# Patient Record
Sex: Female | Born: 1995 | Hispanic: No | Marital: Single | Smoking: Never smoker
Health system: Southern US, Community
[De-identification: ages and names within clinical notes are randomized; demographics above are authoritative.]

## PROBLEM LIST (undated history)

## (undated) DIAGNOSIS — E119 Type 2 diabetes mellitus without complications: Secondary | ICD-10-CM

---

## 2015-04-13 ENCOUNTER — Emergency Department (HOSPITAL_COMMUNITY): Payer: Self-pay

## 2015-04-13 ENCOUNTER — Encounter (HOSPITAL_COMMUNITY): Payer: Self-pay | Admitting: Emergency Medicine

## 2015-04-13 ENCOUNTER — Emergency Department (HOSPITAL_COMMUNITY)
Admission: EM | Admit: 2015-04-13 | Discharge: 2015-04-13 | Disposition: A | Discharge status: Home / Self Care | Payer: Self-pay | Attending: Emergency Medicine | Admitting: Emergency Medicine

## 2015-04-13 DIAGNOSIS — Z79899 Other long term (current) drug therapy: Secondary | ICD-10-CM | POA: Insufficient documentation

## 2015-04-13 DIAGNOSIS — R739 Hyperglycemia, unspecified: Secondary | ICD-10-CM

## 2015-04-13 DIAGNOSIS — E1165 Type 2 diabetes mellitus with hyperglycemia: Secondary | ICD-10-CM | POA: Insufficient documentation

## 2015-04-13 DIAGNOSIS — R112 Nausea with vomiting, unspecified: Secondary | ICD-10-CM | POA: Insufficient documentation

## 2015-04-13 DIAGNOSIS — R079 Chest pain, unspecified: Secondary | ICD-10-CM | POA: Insufficient documentation

## 2015-04-13 HISTORY — DX: Type 2 diabetes mellitus without complications: E11.9

## 2015-04-13 LAB — COMPREHENSIVE METABOLIC PANEL
ALK PHOS: 62 U/L (ref 38–126)
ALT: 38 U/L (ref 14–54)
AST: 23 U/L (ref 15–41)
Albumin: 4 g/dL (ref 3.5–5.0)
Anion gap: 14 (ref 5–15)
BUN: 9 mg/dL (ref 6–20)
CO2: 22 mmol/L (ref 22–32)
Calcium: 9.4 mg/dL (ref 8.9–10.3)
Chloride: 99 mmol/L — ABNORMAL LOW (ref 101–111)
Creatinine, Ser: 0.74 mg/dL (ref 0.44–1.00)
GFR calc non Af Amer: >60 mL/min (ref >60)
Glucose, Bld: 346 mg/dL — ABNORMAL HIGH (ref 65–99)
Potassium: 4.3 mmol/L (ref 3.5–5.1)
Sodium: 135 mmol/L (ref 135–145)
Total Bilirubin: 1.2 mg/dL (ref 0.3–1.2)
Total Protein: 8.3 g/dL — ABNORMAL HIGH (ref 6.5–8.1)

## 2015-04-13 LAB — CBC WITH DIFFERENTIAL/PLATELET
BASOS ABS: 0 10*3/uL (ref 0.0–0.1)
Basophils Relative: 0 % (ref 0–1)
Eosinophils Absolute: 0 10*3/uL (ref 0.0–0.7)
Eosinophils Relative: 0 % (ref 0–5)
HEMATOCRIT: 39.4 % (ref 36.0–46.0)
HEMOGLOBIN: 13.6 g/dL (ref 12.0–15.0)
Lymphocytes Relative: 18 % (ref 12–46)
Lymphs Abs: 2.5 10*3/uL (ref 0.7–4.0)
MCH: 29 pg (ref 26.0–34.0)
MCHC: 34.5 g/dL (ref 30.0–36.0)
MCV: 84 fL (ref 78.0–100.0)
MONO ABS: 0.5 10*3/uL (ref 0.1–1.0)
Monocytes Relative: 3 % (ref 3–12)
NEUTROS PCT: 79 % — AB (ref 43–77)
Neutro Abs: 11.1 10*3/uL — ABNORMAL HIGH (ref 1.7–7.7)
PLATELETS: 334 10*3/uL (ref 150–400)
RBC: 4.69 MIL/uL (ref 3.87–5.11)
RDW: 12.2 % (ref 11.5–15.5)
WBC: 14.1 10*3/uL — ABNORMAL HIGH (ref 4.0–10.5)

## 2015-04-13 LAB — URINALYSIS, ROUTINE W REFLEX MICROSCOPIC
Bilirubin Urine: NEGATIVE
Hgb urine dipstick: NEGATIVE
Ketones, ur: >80 mg/dL — AB
Leukocytes, UA: NEGATIVE
Nitrite: NEGATIVE
Protein, ur: 100 mg/dL — AB
Specific Gravity, Urine: 1.036 — ABNORMAL HIGH (ref 1.005–1.030)
UROBILINOGEN UA: 1 mg/dL (ref 0.0–1.0)
pH: 7 (ref 5.0–8.0)

## 2015-04-13 LAB — URINE MICROSCOPIC-ADD ON

## 2015-04-13 LAB — CBG MONITORING, ED
Glucose-Capillary: 313 mg/dL — ABNORMAL HIGH (ref 65–99)
Glucose-Capillary: 269 mg/dL — ABNORMAL HIGH (ref 65–99)

## 2015-04-13 LAB — I-STAT TROPONIN, ED: TROPONIN I, POC: 0.01 ng/mL (ref 0.00–0.08)

## 2015-04-13 LAB — POC URINE PREG, ED: Preg Test, Ur: NEGATIVE

## 2015-04-13 LAB — D-DIMER, QUANTITATIVE: D-Dimer, Quant: 0.64 ug/mL-FEU — ABNORMAL HIGH (ref 0.00–0.48)

## 2015-04-13 LAB — LIPASE, BLOOD: Lipase: 14 U/L — ABNORMAL LOW (ref 22–51)

## 2015-04-13 MED ORDER — METOCLOPRAMIDE HCL 10 MG PO TABS
10.0000 mg | ORAL_TABLET | Freq: Four times a day (QID) | ORAL | Status: AC
Start: 2015-04-13 — End: ?

## 2015-04-13 MED ORDER — SODIUM CHLORIDE 0.9 % IV BOLUS (SEPSIS)
1000.0000 mL | Freq: Once | INTRAVENOUS | Status: AC
  Administered 2015-04-13: 1000 mL via INTRAVENOUS

## 2015-04-13 MED ORDER — HYDROCODONE-ACETAMINOPHEN 5-325 MG PO TABS
1.0000 | ORAL_TABLET | Freq: Once | ORAL | Status: DC
  Filled 2015-04-13: qty 1

## 2015-04-13 MED ORDER — ONDANSETRON HCL 4 MG/2ML IJ SOLN
4.0000 mg | Freq: Once | INTRAMUSCULAR | Status: AC
  Administered 2015-04-13: 4 mg via INTRAVENOUS
  Filled 2015-04-13: qty 2

## 2015-04-13 MED ORDER — HYDROCODONE-ACETAMINOPHEN 5-325 MG PO TABS
1.0000 | ORAL_TABLET | Freq: Once | ORAL | Status: AC
Start: 2015-04-13 — End: 2015-04-13
  Administered 2015-04-13: 1 via ORAL
  Filled 2015-04-13 (×2): qty 1

## 2015-04-13 MED ORDER — HYDROCODONE-ACETAMINOPHEN 5-325 MG PO TABS: 1.0000 | ORAL_TABLET | Freq: Four times a day (QID) | ORAL | Status: DC | PRN

## 2015-04-13 MED ORDER — MORPHINE SULFATE 2 MG/ML IJ SOLN
2.0000 mg | Freq: Once | INTRAMUSCULAR | Status: AC
  Administered 2015-04-13: 2 mg via INTRAVENOUS
  Filled 2015-04-13: qty 1

## 2015-04-13 MED ORDER — METOCLOPRAMIDE HCL 5 MG/ML IJ SOLN
10.0000 mg | Freq: Once | INTRAMUSCULAR | Status: AC
  Administered 2015-04-13: 10 mg via INTRAVENOUS
  Filled 2015-04-13: qty 2

## 2015-04-13 MED ORDER — GI COCKTAIL ~~LOC~~
30.0000 mL | Freq: Once | ORAL | Status: AC
  Administered 2015-04-13: 30 mL via ORAL
  Filled 2015-04-13: qty 30

## 2015-04-13 MED ORDER — IOHEXOL 350 MG/ML SOLN
80.0000 mL | Freq: Once | INTRAVENOUS | Status: AC | PRN
  Administered 2015-04-13: 16:00:00 via INTRAVENOUS

## 2015-04-13 NOTE — ED Provider Notes (Signed)
Patient CTA negative for pulmonary embolism or other acute finding. Patient tolerating fluids in the ED. Patient well appearing nontoxic and stable for discharge. Discussed ibuprofen patient given prescription for Norco. Driving and sedation precautions provided.  Discussed return precautions with patient. Discussed all results and patient verbalizes understanding and agrees with plan.  Meds given in ED:  Medications  HYDROcodone-acetaminophen (NORCO/VICODIN) 5-325 MG per tablet 1 tablet (1 tablet Oral Not Given 04/13/15 1611)  sodium chloride 0.9 % bolus 1,000 mL (0 mLs Intravenous Stopped 04/13/15 1110)  metoCLOPramide (REGLAN) injection 10 mg (10 mg Intravenous Given 04/13/15 0950)  morphine 2 MG/ML injection 2 mg (2 mg Intravenous Given 04/13/15 0952)  sodium chloride 0.9 % bolus 1,000 mL (0 mLs Intravenous Stopped 04/13/15 1330)  ondansetron (ZOFRAN) injection 4 mg (4 mg Intravenous Given 04/13/15 1132)  HYDROcodone-acetaminophen (NORCO/VICODIN) 5-325 MG per tablet 1 tablet (1 tablet Oral Given 04/13/15 1231)  gi cocktail (Maalox,Lidocaine,Donnatal) (30 mLs Oral Given 04/13/15 1357)  sodium chloride 0.9 % bolus 1,000 mL (1,000 mLs Intravenous New Bag/Given 04/13/15 1611)  iohexol (OMNIPAQUE) 350 MG/ML injection 80 mL ( Intravenous Contrast Given 04/13/15 1559)  metoCLOPramide (REGLAN) injection 10 mg (10 mg Intravenous Given 04/13/15 1616)    New Prescriptions   HYDROCODONE-ACETAMINOPHEN (NORCO/VICODIN) 5-325 MG PER TABLET    Take 1 tablet by mouth every 6 (six) hours as needed.   METOCLOPRAMIDE (REGLAN) 10 MG TABLET    Take 1 tablet (10 mg total) by mouth every 6 (six) hours.    Results for orders placed or performed during the hospital encounter of 04/13/15  CBC with Differential  Result Value Ref Range   WBC 14.1 (H) 4.0 - 10.5 K/uL   RBC 4.69 3.87 - 5.11 MIL/uL   Hemoglobin 13.6 12.0 - 15.0 g/dL   HCT 38.1 01.7 - 51.0 %   MCV 84.0 78.0 - 100.0 fL   MCH 29.0 26.0 - 34.0 pg   MCHC 34.5  30.0 - 36.0 g/dL   RDW 25.8 52.7 - 78.2 %   Platelets 334 150 - 400 K/uL   Neutrophils Relative % 79 (H) 43 - 77 %   Neutro Abs 11.1 (H) 1.7 - 7.7 K/uL   Lymphocytes Relative 18 12 - 46 %   Lymphs Abs 2.5 0.7 - 4.0 K/uL   Monocytes Relative 3 3 - 12 %   Monocytes Absolute 0.5 0.1 - 1.0 K/uL   Eosinophils Relative 0 0 - 5 %   Eosinophils Absolute 0.0 0.0 - 0.7 K/uL   Basophils Relative 0 0 - 1 %   Basophils Absolute 0.0 0.0 - 0.1 K/uL  Comprehensive metabolic panel  Result Value Ref Range   Sodium 135 135 - 145 mmol/L   Potassium 4.3 3.5 - 5.1 mmol/L   Chloride 99 (L) 101 - 111 mmol/L   CO2 22 22 - 32 mmol/L   Glucose, Bld 346 (H) 65 - 99 mg/dL   BUN 9 6 - 20 mg/dL   Creatinine, Ser 4.23 0.44 - 1.00 mg/dL   Calcium 9.4 8.9 - 53.6 mg/dL   Total Protein 8.3 (H) 6.5 - 8.1 g/dL   Albumin 4.0 3.5 - 5.0 g/dL   AST 23 15 - 41 U/L   ALT 38 14 - 54 U/L   Alkaline Phosphatase 62 38 - 126 U/L   Total Bilirubin 1.2 0.3 - 1.2 mg/dL   GFR calc non Af Amer >60 >60 mL/min   GFR calc Af Amer >60 >60 mL/min   Anion gap 14  5 - 15  Urinalysis, Routine w reflex microscopic (not at [facility])  Result Value Ref Range   Color, Urine YELLOW YELLOW   APPearance CLEAR CLEAR   Specific Gravity, Urine 1.036 (H) 1.005 - 1.030   pH 7.0 5.0 - 8.0   Glucose, UA >1000 (A) NEGATIVE mg/dL   Hgb urine dipstick NEGATIVE NEGATIVE   Bilirubin Urine NEGATIVE NEGATIVE   Ketones, ur >80 (A) NEGATIVE mg/dL   Protein, ur 161 (A) NEGATIVE mg/dL   Urobilinogen, UA 1.0 0.0 - 1.0 mg/dL   Nitrite NEGATIVE NEGATIVE   Leukocytes, UA NEGATIVE NEGATIVE  Lipase, blood  Result Value Ref Range   Lipase 14 (L) 22 - 51 U/L  Urine microscopic-add on  Result Value Ref Range   Squamous Epithelial / LPF RARE RARE   Bacteria, UA RARE RARE  D-dimer, quantitative (not at [facility])  Result Value Ref Range   D-Dimer, Quant 0.64 (H) 0.00 - 0.48 ug/mL-FEU  POC Urine Pregnancy, ED (do NOT order at [facility])  Result Value Ref Range   Preg Test,  Ur NEGATIVE NEGATIVE  CBG monitoring, ED  Result Value Ref Range   Glucose-Capillary 313 (H) 65 - 99 mg/dL  CBG monitoring, ED  Result Value Ref Range   Glucose-Capillary 269 (H) 65 - 99 mg/dL  I-Stat Troponin, ED (not at [facility])  Result Value Ref Range   Troponin i, poc 0.01 0.00 - 0.08 ng/mL   Comment 3           Ct Angio Chest Pe W/cm &/or Wo Cm  04/13/2015   CLINICAL DATA:  Chest pain for 2 days.  Recent plane travel  EXAM: CT ANGIOGRAPHY CHEST WITH CONTRAST  TECHNIQUE: Multidetector CT imaging of the chest was performed using the standard protocol during bolus administration of intravenous contrast. Multiplanar CT image reconstructions and MIPs were obtained to evaluate the vascular anatomy.  CONTRAST:  1 OMNIPAQUE IOHEXOL 350 MG/ML SOLN  COMPARISON:  None.  FINDINGS: THORACIC INLET/BODY WALL:  No acute abnormality.  MEDIASTINUM:  Normal heart size. No pericardial effusion. Prominent thymus, but within normal limits for age. No acute vascular abnormality, including pulmonary embolism. No adenopathy.  LUNG WINDOWS:  No consolidation.  No effusion.  UPPER ABDOMEN:  Negative.  OSSEOUS:  Negative.  Review of the MIP images confirms the above findings.  IMPRESSION: Negative for pulmonary embolism or other acute finding.   Electronically Signed   By: Marnee Spring M.D.   On: 04/13/2015 16:26   Dg Abd Acute W/chest  04/13/2015   CLINICAL DATA:  Vomiting since yesterday morning, chest pain beginning yesterday later in day, history diabetes  EXAM: DG ABDOMEN ACUTE W/ 1V CHEST  COMPARISON:  None  FINDINGS: Upper normal heart size. None Mediastinal contours and pulmonary vascularity normal.  Lungs clear.  No pleural effusion or pneumothorax.  Bowel gas pattern normal.  No bowel dilatation or bowel wall thickening or free intraperitoneal air.  Tiny rounded calcification in RIGHT pelvis likely represents a phlebolith, see below.  No definite urinary tract calcification.  Osseous structures normal.   IMPRESSION: No acute abnormalities.  Probable phlebolith in RIGHT pelvis though if patient says symptoms which could be related to RIGHT ureterolithiasis, recommend correlation with urinalysis.   Electronically Signed   By: Ulyses Southward M.D.   On: 04/13/2015 10:42      Oswaldo Conroy, PA-C 04/13/15 1703  Doug Sou, MD 04/13/15 [phone]

## 2015-04-13 NOTE — Discharge Instructions (Signed)
Return to the emergency room with worsening of symptoms, new symptoms or with symptoms that are concerning , especially chest pain that feels like a pressure, spreads to left arm or jaw, worse with exertion, associated with nausea, vomiting, shortness of breath and/or sweating.  Please call your doctor for a followup appointment within 24-48 hours. When you talk to your doctor please let them know that you were seen in the emergency department and have them acquire all of your records so that they can discuss the findings with you and formulate a treatment plan to fully care for your new and ongoing problems. RICE: Rest, Ice (three cycles of 20 mins on, off at least twice a day), compression/brace, elevation. Heating pad works well for back pain. Ibuprofen  (2 tablets ) every 5-6 hours for 3-5 days. Norco for severe pain. Do not operate machinery, drive or drink alcohol while taking narcotics or muscle relaxers. Read below information and follow recommendations.   Chest Pain (Nonspecific) It is often hard to give a specific diagnosis for the cause of chest pain. There is always a chance that your pain could be related to something serious, such as a heart attack or a blood clot in the lungs. You need to follow up with your health care provider for further evaluation. CAUSES   Heartburn.  Pneumonia or bronchitis.  Anxiety or stress.  Inflammation around your heart (pericarditis) or lung (pleuritis or pleurisy).  A blood clot in the lung.  A collapsed lung (pneumothorax). It can develop suddenly on its own (spontaneous pneumothorax) or from trauma to the chest.  Shingles infection (herpes zoster virus). The chest wall is composed of bones, muscles, and cartilage. Any of these can be the source of the pain.  The bones can be bruised by injury.  The muscles or cartilage can be strained by coughing or overwork.  The cartilage can be affected by inflammation and become sore  (costochondritis). DIAGNOSIS  Lab tests or other studies may be needed to find the cause of your pain. Your health care provider may have you take a test called an ambulatory electrocardiogram (ECG). An ECG records your heartbeat patterns over a 24-hour period. You may also have other tests, such as:  Transthoracic echocardiogram (TTE). During echocardiography, sound waves are used to evaluate how blood flows through your heart.  Transesophageal echocardiogram (TEE).  Cardiac monitoring. This allows your health care provider to monitor your heart rate and rhythm in real time.  Holter monitor. This is a portable device that records your heartbeat and can help diagnose heart arrhythmias. It allows your health care provider to track your heart activity for several days, if needed.  Stress tests by exercise or by giving medicine that makes the heart beat faster. TREATMENT   Treatment depends on what may be causing your chest pain. Treatment may include:  Acid blockers for heartburn.  Anti-inflammatory medicine.  Pain medicine for inflammatory conditions.  Antibiotics if an infection is present.  You may be advised to change lifestyle habits. This includes stopping smoking and avoiding alcohol, caffeine, and chocolate.  You may be advised to keep your head raised (elevated) when sleeping. This reduces the chance of acid going backward from your stomach into your esophagus. Most of the time, nonspecific chest pain will improve within 2-3 days with rest and mild pain medicine.  HOME CARE INSTRUCTIONS   If antibiotics were prescribed, take them as directed. Finish them even if you start to feel better.  For the  next few days, avoid physical activities that bring on chest pain. Continue physical activities as directed.  Do not use any tobacco products, including cigarettes, chewing tobacco, or electronic cigarettes.  Avoid drinking alcohol.  Only take medicine as directed by your health  care provider.  Follow your health care provider's suggestions for further testing if your chest pain does not go away.  Keep any follow-up appointments you made. If you do not go to an appointment, you could develop lasting (chronic) problems with pain. If there is any problem keeping an appointment, call to reschedule. SEEK MEDICAL CARE IF:   Your chest pain does not go away, even after treatment.  You have a rash with blisters on your chest.  You have a fever. SEEK IMMEDIATE MEDICAL CARE IF:   You have increased chest pain or pain that spreads to your arm, neck, jaw, back, or abdomen.  You have shortness of breath.  You have an increasing cough, or you cough up blood.  You have severe back or abdominal pain.  You feel nauseous or vomit.  You have severe weakness.  You faint.  You have chills. This is an emergency. Do not wait to see if the pain will go away. Get medical help at once. Call your local emergency services (911 in [place] Do not drive yourself to the hospital. MAKE SURE YOU:   Understand these instructions.  Will watch your condition.  Will get help right away if you are not doing well or get worse. Document Released: 07/30/2005 Document Revised: 10/25/2013 Document Reviewed: 05/25/2008 [facility] Patient Information 2015 [place], [place]. This information is not intended to replace advice given to you by your health care provider. Make sure you discuss any questions you have with your health care provider.  Hyperglycemia Hyperglycemia occurs when the glucose (sugar) in your blood is too high. Hyperglycemia can happen for many reasons, but it most often happens to people who do not know they have diabetes or are not managing their diabetes properly.  CAUSES  Whether you have diabetes or not, there are other causes of hyperglycemia. Hyperglycemia can occur when you have diabetes, but it can also occur in other situations that you might not be as aware of, such  as: Diabetes  If you have diabetes and are having problems controlling your blood glucose, hyperglycemia could occur because of some of the following reasons:  Not following your meal plan.  Not taking your diabetes medications or not taking it properly.  Exercising less or doing less activity than you normally do.  Being sick. Pre-diabetes  This cannot be ignored. Before people develop Type 2 diabetes, they almost always have "pre-diabetes." This is when your blood glucose levels are higher than normal, but not yet high enough to be diagnosed as diabetes. Research has shown that some long-term damage to the body, especially the heart and circulatory system, may already be occurring during pre-diabetes. If you take action to manage your blood glucose when you have pre-diabetes, you may delay or prevent Type 2 diabetes from developing. Stress  If you have diabetes, you may be "diet" controlled or on oral medications or insulin to control your diabetes. However, you may find that your blood glucose is higher than usual in the hospital whether you have diabetes or not. This is often referred to as "stress hyperglycemia." Stress can elevate your blood glucose. This happens because of hormones put out by the body during times of stress. If stress has been the cause of  your high blood glucose, it can be followed regularly by your caregiver. That way he/she can make sure your hyperglycemia does not continue to get worse or progress to diabetes. Steroids  Steroids are medications that act on the infection fighting system (immune system) to block inflammation or infection. One side effect can be a rise in blood glucose. Most people can produce enough extra insulin to allow for this rise, but for those who cannot, steroids make blood glucose levels go even higher. It is not unusual for steroid treatments to "uncover" diabetes that is developing. It is not always possible to determine if the hyperglycemia  will go away after the steroids are stopped. A special blood test called an A1c is sometimes done to determine if your blood glucose was elevated before the steroids were started. SYMPTOMS  Thirsty.  Frequent urination.  Dry mouth.  Blurred vision.  Tired or fatigue.  Weakness.  Sleepy.  Tingling in feet or leg. DIAGNOSIS  Diagnosis is made by monitoring blood glucose in one or all of the following ways:  A1c test. This is a chemical found in your blood.  Fingerstick blood glucose monitoring.  Laboratory results. TREATMENT  First, knowing the cause of the hyperglycemia is important before the hyperglycemia can be treated. Treatment may include, but is not be limited to:  Education.  Change or adjustment in medications.  Change or adjustment in meal plan.  Treatment for an illness, infection, etc.  More frequent blood glucose monitoring.  Change in exercise plan.  Decreasing or stopping steroids.  Lifestyle changes. HOME CARE INSTRUCTIONS   Test your blood glucose as directed.  Exercise regularly. Your caregiver will give you instructions about exercise. Pre-diabetes or diabetes which comes on with stress is helped by exercising.  Eat wholesome, balanced meals. Eat often and at regular, fixed times. Your caregiver or nutritionist will give you a meal plan to guide your sugar intake.  Being at an ideal weight is important. If needed, losing as little as 10 to 15 pounds may help improve blood glucose levels. SEEK MEDICAL CARE IF:   You have questions about medicine, activity, or diet.  You continue to have symptoms (problems such as increased thirst, urination, or weight gain). SEEK IMMEDIATE MEDICAL CARE IF:   You are vomiting or have diarrhea.  Your breath smells fruity.  You are breathing faster or slower.  You are very sleepy or incoherent.  You have numbness, tingling, or pain in your feet or hands.  You have chest pain.  Your symptoms get  worse even though you have been following your caregiver's orders.  If you have any other questions or concerns. Document Released: 04/15/2001 Document Revised: 01/12/2012 Document Reviewed: 02/16/2012 [facility] Patient Information 2015 [place], [place]. This information is not intended to replace advice given to you by your health care provider. Make sure you discuss any questions you have with your health care provider.

## 2015-04-13 NOTE — ED Provider Notes (Signed)
Complains of vomiting multiple times since 8 AM yesterday accompanied by anterior chest pain which is constant since 8 AM yesterday no abdominal pain last bowel movement 12 PM yesterday. No fever no other associated symptoms she is asymptomatic since treatment here except "I feel tired" on exam no distress lungs clear auscultation heart regular rate and rhythm no murmurs or rubs abdomen obese, nontender. Extremities without edema X-rays viewed by me. No signs of acute appendicitis. Strongly doubt cardiac etiology in this young female with atypical symptoms only risk factor being diabetes  Doug Sou, MD 04/13/15 [phone]

## 2015-04-13 NOTE — ED Notes (Signed)
Pt reports vomiting since yesterday morning. Pt also report CP associated with the vomiting. Pt is diabetic. NAD at this time.

## 2015-04-13 NOTE — ED Notes (Signed)
Pts SpO2 went to 70% while sleeping when pt awoke O2 improved to 95%, pt placed on [place] 2L and O2 improved to 100%

## 2015-04-13 NOTE — ED Provider Notes (Signed)
CSN: [id number]     Arrival date & time 04/13/15  7289 History   First MD Initiated Contact with Patient 04/13/15 0915     Chief Complaint  Patient presents with  . Emesis     (Consider location/radiation/quality/duration/timing/severity/associated sxs/prior Treatment) HPI  Haley Cannon is a 19 y.o. female with PMH of DM on metformin presenting with nausea and vomiting since yesterday morning. Patient unclear how many times. Patient states he has associated chest tightness when she vomits results after emesis. Patient denies any hematemesis or blood in emesis. Patient denies abdominal pain. No fevers or chills. No vaginal symptoms. No urinary symptoms. Patient denies diarrhea. Last BM yesterday and normal without blood. Patient denies history of abdominal surgeries. Patient denies history of DKA. No history of cardiac disease. Pt denies history of DVT, PE, recent surgery or trauma, malignancy, hemoptysis, exogenous estrogen use, unilateral leg swelling or tenderness. Pt with recent flight from [place] which was 5-6 hours. Pt endorses ambulating once during trip.    Past Medical History  Diagnosis Date  . Diabetes mellitus without complication    History reviewed. No pertinent past surgical history. No family history on file. History  Substance Use Topics  . Smoking status: Never Smoker   . Smokeless tobacco: Not on file  . Alcohol Use: No   OB History    No data available     Review of Systems 10 Systems reviewed and are negative for acute change except as noted in the HPI.    Allergies  Review of patient's allergies indicates no known allergies.  Home Medications   Prior to Admission medications   Medication Sig Start Date End Date Taking? Authorizing Provider  metFORMIN (GLUCOPHAGE) 500 MG tablet Take 500 mg by mouth 2 (two) times daily. 02/26/15  Yes Historical Provider, MD   BP 105/64 mmHg  Pulse 87  Temp(Src) 98 F (36.7 C) (Oral)  Resp 19  SpO2 99%  LMP  03/13/2015 Physical Exam  Constitutional: She appears well-developed and well-nourished. No distress.  HENT:  Head: Normocephalic and atraumatic.  Dry mucous membranes  Eyes: Conjunctivae and EOM are normal. Right eye exhibits no discharge. Left eye exhibits no discharge.  Cardiovascular: Normal rate and regular rhythm.   Pulmonary/Chest: Effort normal and breath sounds normal. No respiratory distress. She has no wheezes.  Abdominal: Soft. Bowel sounds are normal. She exhibits no distension. There is no tenderness.  Neurological: She is alert. She exhibits normal muscle tone. Coordination normal.  Skin: Skin is warm and dry. She is not diaphoretic.  Nursing note and vitals reviewed.   ED Course  Procedures (including critical care time) Labs Review Labs Reviewed  CBC WITH DIFFERENTIAL/PLATELET - Abnormal; Notable for the following:    WBC 14.1 (*)    Neutrophils Relative % 79 (*)    Neutro Abs 11.1 (*)    All other components within normal limits  COMPREHENSIVE METABOLIC PANEL - Abnormal; Notable for the following:    Chloride 99 (*)    Glucose, Bld 346 (*)    Total Protein 8.3 (*)    All other components within normal limits  URINALYSIS, ROUTINE W REFLEX MICROSCOPIC (NOT AT [facility]) - Abnormal; Notable for the following:    Specific Gravity, Urine 1.036 (*)    Glucose, UA >1000 (*)    Ketones, ur >80 (*)    Protein, ur 100 (*)    All other components within normal limits  LIPASE, BLOOD - Abnormal; Notable for the following:    Lipase  14 (*)    All other components within normal limits  D-DIMER, QUANTITATIVE (NOT AT [facility]) - Abnormal; Notable for the following:    D-Dimer, Quant 0.64 (*)    All other components within normal limits  CBG MONITORING, ED - Abnormal; Notable for the following:    Glucose-Capillary 313 (*)    All other components within normal limits  CBG MONITORING, ED - Abnormal; Notable for the following:    Glucose-Capillary 269 (*)    All other components  within normal limits  URINE MICROSCOPIC-ADD ON  POC URINE PREG, ED  CBG MONITORING, ED  CBG MONITORING, ED  I-STAT TROPOININ, ED    Imaging Review Dg Abd Acute W/chest  04/13/2015   CLINICAL DATA:  Vomiting since yesterday morning, chest pain beginning yesterday later in day, history diabetes  EXAM: DG ABDOMEN ACUTE W/ 1V CHEST  COMPARISON:  None  FINDINGS: Upper normal heart size. None Mediastinal contours and pulmonary vascularity normal.  Lungs clear.  No pleural effusion or pneumothorax.  Bowel gas pattern normal.  No bowel dilatation or bowel wall thickening or free intraperitoneal air.  Tiny rounded calcification in RIGHT pelvis likely represents a phlebolith, see below.  No definite urinary tract calcification.  Osseous structures normal.  IMPRESSION: No acute abnormalities.  Probable phlebolith in RIGHT pelvis though if patient says symptoms which could be related to RIGHT ureterolithiasis, recommend correlation with urinalysis.   Electronically Signed   By: Ulyses Southward M.D.   On: 04/13/2015 10:42     EKG Interpretation   Date/Time:  Friday April 13 2015 09:58:27 EDT Ventricular Rate:  71 PR Interval:  153 QRS Duration: 79 QT Interval:  415 QTC Calculation: 451 R Axis:   46 Text Interpretation:  Sinus arrhythmia Borderline Q waves in inferior  leads Baseline wander in lead(s) V3 No old tracing to compare Confirmed by  Ethelda Chick  MD, SAM [phone]) on 04/13/2015 10:02:28 AM      Meds given in ED:  Medications  HYDROcodone-acetaminophen (NORCO/VICODIN) 5-325 MG per tablet 1 tablet (not administered)  sodium chloride 0.9 % bolus 1,000 mL (not administered)  sodium chloride 0.9 % bolus 1,000 mL (0 mLs Intravenous Stopped 04/13/15 1110)  metoCLOPramide (REGLAN) injection 10 mg (10 mg Intravenous Given 04/13/15 0950)  morphine 2 MG/ML injection 2 mg (2 mg Intravenous Given 04/13/15 0952)  sodium chloride 0.9 % bolus 1,000 mL (0 mLs Intravenous Stopped 04/13/15 1330)  ondansetron  (ZOFRAN) injection 4 mg (4 mg Intravenous Given 04/13/15 1132)  HYDROcodone-acetaminophen (NORCO/VICODIN) 5-325 MG per tablet 1 tablet (1 tablet Oral Given 04/13/15 1231)  gi cocktail (Maalox,Lidocaine,Donnatal) (30 mLs Oral Given 04/13/15 1357)  iohexol (OMNIPAQUE) 350 MG/ML injection 80 mL ( Intravenous Contrast Given 04/13/15 1559)    New Prescriptions   No medications on file      MDM   Final diagnoses:  Chest pain, unspecified chest pain type  Hyperglycemia  Non-intractable vomiting with nausea, vomiting of unspecified type  Chest pain   Patient presenting with nausea and vomiting with history of diabetes with associated chest pain that is constant since yesterday. VSS. No hypoxia, no tachycardia no significant tachypnea. No abdominal tenderness on exam. Pt with hyperglycemia and given 2 L fluid with improvement to 269. No anion gap. UA without evidence of infection, pt dehydrated with ketones. Acute abdominal series without acute abnormalities with possible plebolith verse right Ureterolithiasis. No abdominal tenderness. EKG without evidence of acute ischemia. Pt low risk for ACS. Pt with history of long flight from  [place]. With low risk for PE. Pt with persistent chest pain. Ddimer and troponin ordered.  Pt with negative troponin. I doubt ACS. D-dimer elevated. Discussed risks and benefits of CTA as well as financial cost. Pt self-pay. Pt initially refused and pt not eligible for orange card due to residence in [place]. Pt discussed with family and has agreed to CTA.  Pt signed out to Dr. Margorie John, MD at shift change. Pending CTA results.  This is a shared patient. This patient was discussed with the physician who saw and evaluated the patient and agrees with the plan.   Oswaldo Conroy, PA-C 04/13/15 1621  Doug Sou, MD 04/13/15 1725

## 2015-04-13 NOTE — ED Notes (Signed)
Pt tolerated fluid intake. 

## 2015-04-13 NOTE — ED Notes (Signed)
PA at the bedside.

## 2015-04-13 NOTE — ED Notes (Signed)
Pt d/c'd from IV, monitor, continuous pulse oximetry and blood pressure cuff; pt getting dressed to be discharged home 

## 2015-04-13 NOTE — ED Notes (Signed)
Pt reports after drinking water her pain returned to her chest.

## 2015-04-14 ENCOUNTER — Encounter (HOSPITAL_COMMUNITY): Payer: Self-pay | Admitting: Emergency Medicine

## 2015-04-14 ENCOUNTER — Emergency Department (HOSPITAL_COMMUNITY): Payer: Self-pay

## 2015-04-14 ENCOUNTER — Inpatient Hospital Stay (HOSPITAL_COMMUNITY)
Admission: EM | Admit: 2015-04-14 | Discharge: 2015-04-17 | DRG: 638 | Disposition: A | Discharge status: Home / Self Care | Payer: Self-pay | Attending: Internal Medicine | Admitting: Internal Medicine

## 2015-04-14 DIAGNOSIS — D72829 Elevated white blood cell count, unspecified: Secondary | ICD-10-CM | POA: Diagnosis present

## 2015-04-14 DIAGNOSIS — E1110 Type 2 diabetes mellitus with ketoacidosis without coma: Secondary | ICD-10-CM

## 2015-04-14 DIAGNOSIS — IMO0002 Reserved for concepts with insufficient information to code with codable children: Secondary | ICD-10-CM | POA: Diagnosis present

## 2015-04-14 DIAGNOSIS — Z9119 Patient's noncompliance with other medical treatment and regimen: Secondary | ICD-10-CM | POA: Diagnosis present

## 2015-04-14 DIAGNOSIS — R1110 Vomiting, unspecified: Secondary | ICD-10-CM

## 2015-04-14 DIAGNOSIS — R112 Nausea with vomiting, unspecified: Secondary | ICD-10-CM | POA: Diagnosis present

## 2015-04-14 DIAGNOSIS — E1165 Type 2 diabetes mellitus with hyperglycemia: Secondary | ICD-10-CM

## 2015-04-14 DIAGNOSIS — R079 Chest pain, unspecified: Secondary | ICD-10-CM | POA: Insufficient documentation

## 2015-04-14 DIAGNOSIS — A419 Sepsis, unspecified organism: Secondary | ICD-10-CM

## 2015-04-14 DIAGNOSIS — R072 Precordial pain: Secondary | ICD-10-CM | POA: Diagnosis present

## 2015-04-14 DIAGNOSIS — R6510 Systemic inflammatory response syndrome (SIRS) of non-infectious origin without acute organ dysfunction: Secondary | ICD-10-CM | POA: Diagnosis present

## 2015-04-14 DIAGNOSIS — I10 Essential (primary) hypertension: Secondary | ICD-10-CM | POA: Diagnosis present

## 2015-04-14 DIAGNOSIS — R0789 Other chest pain: Secondary | ICD-10-CM | POA: Diagnosis present

## 2015-04-14 DIAGNOSIS — E1310 Other specified diabetes mellitus with ketoacidosis without coma: Principal | ICD-10-CM | POA: Diagnosis present

## 2015-04-14 DIAGNOSIS — R109 Unspecified abdominal pain: Secondary | ICD-10-CM | POA: Insufficient documentation

## 2015-04-14 DIAGNOSIS — Z79899 Other long term (current) drug therapy: Secondary | ICD-10-CM

## 2015-04-14 DIAGNOSIS — E876 Hypokalemia: Secondary | ICD-10-CM | POA: Diagnosis present

## 2015-04-14 DIAGNOSIS — E860 Dehydration: Secondary | ICD-10-CM | POA: Diagnosis present

## 2015-04-14 DIAGNOSIS — Z6838 Body mass index (BMI) 38.0-38.9, adult: Secondary | ICD-10-CM

## 2015-04-14 LAB — CBC WITH DIFFERENTIAL/PLATELET
Basophils Absolute: 0 10*3/uL (ref 0.0–0.1)
Basophils Relative: 0 % (ref 0–1)
EOS ABS: 0 10*3/uL (ref 0.0–0.7)
Eosinophils Relative: 0 % (ref 0–5)
HCT: 41.5 % (ref 36.0–46.0)
HEMOGLOBIN: 14.5 g/dL (ref 12.0–15.0)
LYMPHS ABS: 2.4 10*3/uL (ref 0.7–4.0)
LYMPHS PCT: 19 % (ref 12–46)
MCH: 29.3 pg (ref 26.0–34.0)
MCHC: 34.9 g/dL (ref 30.0–36.0)
MCV: 83.8 fL (ref 78.0–100.0)
MONO ABS: 0.5 10*3/uL (ref 0.1–1.0)
MONOS PCT: 4 % (ref 3–12)
NEUTROS ABS: 9.7 10*3/uL — AB (ref 1.7–7.7)
NEUTROS PCT: 77 % (ref 43–77)
Platelets: 330 10*3/uL (ref 150–400)
RBC: 4.95 MIL/uL (ref 3.87–5.11)
RDW: 12.2 % (ref 11.5–15.5)
WBC: 12.6 10*3/uL — ABNORMAL HIGH (ref 4.0–10.5)

## 2015-04-14 LAB — COMPREHENSIVE METABOLIC PANEL
ALT: 34 U/L (ref 14–54)
ANION GAP: 15 (ref 5–15)
AST: 17 U/L (ref 15–41)
Albumin: 3.9 g/dL (ref 3.5–5.0)
Alkaline Phosphatase: 62 U/L (ref 38–126)
BUN: 7 mg/dL (ref 6–20)
CALCIUM: 9.3 mg/dL (ref 8.9–10.3)
CHLORIDE: 98 mmol/L — AB (ref 101–111)
CO2: 21 mmol/L — AB (ref 22–32)
Creatinine, Ser: 0.6 mg/dL (ref 0.44–1.00)
GFR calc non Af Amer: >60 mL/min (ref >60)
GLUCOSE: 291 mg/dL — AB (ref 65–99)
POTASSIUM: 3.4 mmol/L — AB (ref 3.5–5.1)
Sodium: 134 mmol/L — ABNORMAL LOW (ref 135–145)
TOTAL PROTEIN: 8.5 g/dL — AB (ref 6.5–8.1)
Total Bilirubin: 1.2 mg/dL (ref 0.3–1.2)

## 2015-04-14 LAB — LIPASE, BLOOD: Lipase: 15 U/L — ABNORMAL LOW (ref 22–51)

## 2015-04-14 LAB — PROCALCITONIN

## 2015-04-14 LAB — GLUCOSE, CAPILLARY
GLUCOSE-CAPILLARY: 223 mg/dL — AB (ref 65–99)
GLUCOSE-CAPILLARY: 181 mg/dL — AB (ref 65–99)
Glucose-Capillary: 182 mg/dL — ABNORMAL HIGH (ref 65–99)

## 2015-04-14 LAB — CBG MONITORING, ED: GLUCOSE-CAPILLARY: 264 mg/dL — AB (ref 65–99)

## 2015-04-14 LAB — ETHANOL

## 2015-04-14 LAB — TROPONIN I
Troponin I: <0.03 ng/mL (ref <0.031)
Troponin I: <0.03 ng/mL (ref <0.031)

## 2015-04-14 LAB — LACTIC ACID, PLASMA
LACTIC ACID, VENOUS: 1.3 mmol/L (ref 0.5–2.0)
Lactic Acid, Venous: 1.6 mmol/L (ref 0.5–2.0)

## 2015-04-14 LAB — TSH: TSH: 0.633 u[IU]/mL (ref 0.350–4.500)

## 2015-04-14 MED ORDER — PROMETHAZINE HCL 25 MG/ML IJ SOLN
12.5000 mg | Freq: Four times a day (QID) | INTRAMUSCULAR | Status: DC | PRN
  Administered 2015-04-14 – 2015-04-15 (×2): 12.5 mg via INTRAVENOUS
  Filled 2015-04-14 (×3): qty 1

## 2015-04-14 MED ORDER — INSULIN ASPART 100 UNIT/ML ~~LOC~~ SOLN
0.0000 [IU] | SUBCUTANEOUS | Status: DC
  Administered 2015-04-14: 4 [IU] via SUBCUTANEOUS
  Administered 2015-04-14: 7 [IU] via SUBCUTANEOUS
  Administered 2015-04-14 – 2015-04-15 (×4): 4 [IU] via SUBCUTANEOUS
  Administered 2015-04-15: 3 [IU] via SUBCUTANEOUS
  Administered 2015-04-15: 7 [IU] via SUBCUTANEOUS
  Administered 2015-04-15 – 2015-04-16 (×2): 4 [IU] via SUBCUTANEOUS
  Administered 2015-04-16: 7 [IU] via SUBCUTANEOUS

## 2015-04-14 MED ORDER — SODIUM CHLORIDE 0.9 % IV SOLN
INTRAVENOUS | Status: DC
  Administered 2015-04-14 – 2015-04-15 (×3): via INTRAVENOUS

## 2015-04-14 MED ORDER — MORPHINE SULFATE 4 MG/ML IJ SOLN
4.0000 mg | Freq: Once | INTRAMUSCULAR | Status: AC
  Administered 2015-04-14: 4 mg via INTRAVENOUS
  Filled 2015-04-14: qty 1

## 2015-04-14 MED ORDER — ALUM & MAG HYDROXIDE-SIMETH 200-200-20 MG/5ML PO SUSP
30.0000 mL | Freq: Four times a day (QID) | ORAL | Status: DC | PRN
  Administered 2015-04-17: 30 mL via ORAL
  Filled 2015-04-14: qty 30

## 2015-04-14 MED ORDER — PANTOPRAZOLE SODIUM 40 MG IV SOLR
40.0000 mg | Freq: Once | INTRAVENOUS | Status: AC
  Administered 2015-04-14: 40 mg via INTRAVENOUS
  Filled 2015-04-14: qty 40

## 2015-04-14 MED ORDER — MORPHINE SULFATE 2 MG/ML IJ SOLN
1.0000 mg | INTRAMUSCULAR | Status: DC | PRN
  Administered 2015-04-14 – 2015-04-17 (×12): 1 mg via INTRAVENOUS
  Filled 2015-04-14 (×13): qty 1

## 2015-04-14 MED ORDER — METOCLOPRAMIDE HCL 5 MG/ML IJ SOLN
5.0000 mg | Freq: Four times a day (QID) | INTRAMUSCULAR | Status: DC
  Administered 2015-04-14 – 2015-04-16 (×7): 5 mg via INTRAVENOUS
  Filled 2015-04-14 (×12): qty 1

## 2015-04-14 MED ORDER — SODIUM CHLORIDE 0.9 % IV BOLUS (SEPSIS)
1000.0000 mL | Freq: Once | INTRAVENOUS | Status: AC
  Administered 2015-04-14: 1000 mL via INTRAVENOUS

## 2015-04-14 MED ORDER — FENTANYL CITRATE (PF) 100 MCG/2ML IJ SOLN
50.0000 ug | Freq: Once | INTRAMUSCULAR | Status: AC
  Administered 2015-04-14: 50 ug via INTRAVENOUS
  Filled 2015-04-14: qty 2

## 2015-04-14 MED ORDER — KETOROLAC TROMETHAMINE 15 MG/ML IJ SOLN
15.0000 mg | Freq: Once | INTRAMUSCULAR | Status: AC
  Administered 2015-04-14: 15 mg via INTRAVENOUS
  Filled 2015-04-14: qty 1

## 2015-04-14 MED ORDER — FOLIC ACID 5 MG/ML IJ SOLN
1.0000 mg | Freq: Every day | INTRAMUSCULAR | Status: DC
  Administered 2015-04-14 – 2015-04-17 (×4): 1 mg via INTRAVENOUS
  Filled 2015-04-14 (×5): qty 0.2

## 2015-04-14 MED ORDER — KETOROLAC TROMETHAMINE 15 MG/ML IJ SOLN
15.0000 mg | Freq: Four times a day (QID) | INTRAMUSCULAR | Status: DC
  Administered 2015-04-14 – 2015-04-16 (×7): 15 mg via INTRAVENOUS
  Filled 2015-04-14 (×12): qty 1

## 2015-04-14 MED ORDER — HEPARIN SODIUM (PORCINE) 5000 UNIT/ML IJ SOLN
5000.0000 [IU] | Freq: Three times a day (TID) | INTRAMUSCULAR | Status: DC
  Administered 2015-04-14 – 2015-04-17 (×9): 5000 [IU] via SUBCUTANEOUS
  Filled 2015-04-14 (×10): qty 1

## 2015-04-14 MED ORDER — THIAMINE HCL 100 MG/ML IJ SOLN
100.0000 mg | Freq: Every day | INTRAMUSCULAR | Status: DC
  Administered 2015-04-14 – 2015-04-17 (×4): 100 mg via INTRAVENOUS
  Filled 2015-04-14 (×5): qty 1

## 2015-04-14 MED ORDER — POTASSIUM CHLORIDE CRYS ER 20 MEQ PO TBCR
20.0000 meq | EXTENDED_RELEASE_TABLET | Freq: Two times a day (BID) | ORAL | Status: DC
  Administered 2015-04-14: 20 meq via ORAL
  Filled 2015-04-14 (×2): qty 1

## 2015-04-14 MED ORDER — CETYLPYRIDINIUM CHLORIDE 0.05 % MT LIQD
7.0000 mL | Freq: Two times a day (BID) | OROMUCOSAL | Status: DC
  Administered 2015-04-14 – 2015-04-16 (×5): 7 mL via OROMUCOSAL

## 2015-04-14 MED ORDER — GI COCKTAIL ~~LOC~~
30.0000 mL | Freq: Once | ORAL | Status: AC
  Administered 2015-04-14: 30 mL via ORAL
  Filled 2015-04-14: qty 30

## 2015-04-14 MED ORDER — ONDANSETRON HCL 4 MG/2ML IJ SOLN
4.0000 mg | Freq: Four times a day (QID) | INTRAMUSCULAR | Status: DC | PRN
  Administered 2015-04-14 – 2015-04-17 (×9): 4 mg via INTRAVENOUS
  Filled 2015-04-14 (×9): qty 2

## 2015-04-14 MED ORDER — METOCLOPRAMIDE HCL 5 MG/ML IJ SOLN
10.0000 mg | Freq: Once | INTRAMUSCULAR | Status: AC
  Administered 2015-04-14: 10 mg via INTRAVENOUS
  Filled 2015-04-14: qty 2

## 2015-04-14 MED ORDER — PANTOPRAZOLE SODIUM 40 MG IV SOLR
40.0000 mg | Freq: Two times a day (BID) | INTRAVENOUS | Status: DC
  Administered 2015-04-14 – 2015-04-16 (×4): 40 mg via INTRAVENOUS
  Filled 2015-04-14 (×5): qty 40

## 2015-04-14 NOTE — ED Notes (Signed)
Attempted report 

## 2015-04-14 NOTE — Progress Notes (Signed)
NURSING PROGRESS NOTE  Haley Cannon [id number] Admission Data: 04/14/2015 4:58 PM Attending Provider: Alison Murray, MD PCP:No PCP Per Patient Code Status: Full  Haley Cannon is a 19 y.o. female patient admitted from ED:  -No acute distress noted.  -No complaints of shortness of breath.  -No complaints of chest pain.   Cardiac Monitoring: Box # 07 in place. Cardiac monitor yields:normal sinus rhythm with sinus arrhythmia.  Blood pressure 153/94, pulse 94, temperature 98.9 F (37.2 C), temperature source Oral, resp. rate 18, last menstrual period 03/13/2015, SpO2 100 %.   IV Fluids:  IV in place, occlusive dsg intact without redness, IV cath antecubital right, condition patent and no redness normal saline.   Allergies:  Review of patient's allergies indicates no known allergies.  Past Medical History:   has a past medical history of Diabetes mellitus without complication.  Past Surgical History:   has no past surgical history on file.  Social History:   reports that she has never smoked. She does not have any smokeless tobacco history on file. She reports that she does not drink alcohol or use illicit drugs.  Skin: Intact  Patient/Family orientated to room. Information packet given to patient/family. Admission inpatient armband information verified with patient/family to include name and date of birth and placed on patient arm. Side rails up x 2, fall assessment and education completed with patient/family. Patient/family able to verbalize understanding of risk associated with falls and verbalized understanding to call for assistance before getting out of bed. Call light within reach. Patient/family able to voice and demonstrate understanding of unit orientation instructions.    Will continue to evaluate and treat per MD orders.

## 2015-04-14 NOTE — ED Provider Notes (Signed)
CSN: [id number]     Arrival date & time 04/14/15  0806 History   First MD Initiated Contact with Patient 04/14/15 [id number]     Chief Complaint  Patient presents with  . Chest Pain  . Emesis     (Consider location/radiation/quality/duration/timing/severity/associated sxs/prior Treatment) HPI   Haley Cannon Is a 19 year old female seen in the emergency department yesterday for abdominal pain, nausea, vomiting and chest pain. Patient had a thorough workup with symptomatic intervention and was released yesterday. She had a negative CT angiogram of the chest. She has a past medical history of type 2 diabetes mellitus and is currently visiting from out of town. The patient returns today complaining of severe chest pain. 2. Relative is at the bedside and states that they did not fill her pain medication last night because she was feeling so well. However, she began having nausea and vomiting overnight. Again, and has come in for repeat examination. He complains of retrosternal chest pain which she describes as burning, severe. She has shallow breathing. She denies a history of gastroparesis or DKA. She denies a history of cardiac disease. Patient denies hematemesis, abdominal pain, diarrhea. She denies fever, chills, urinary symptoms or vaginal symptoms. Patient had a negative urine pregnancy test yesterday.  Past Medical History  Diagnosis Date  . Diabetes mellitus without complication    No past surgical history on file. No family history on file. History  Substance Use Topics  . Smoking status: Never Smoker   . Smokeless tobacco: Not on file  . Alcohol Use: No   OB History    No data available     Review of Systems  Ten systems reviewed and are negative for acute change, except as noted in the HPI.    Allergies  Review of patient's allergies indicates no known allergies.  Home Medications   Prior to Admission medications   Medication Sig Start Date End Date Taking? Authorizing  Provider  HYDROcodone-acetaminophen (NORCO/VICODIN) 5-325 MG per tablet Take 1 tablet by mouth every 6 (six) hours as needed. 04/13/15   Oswaldo Conroy, PA-C  metFORMIN (GLUCOPHAGE) 500 MG tablet Take 500 mg by mouth 2 (two) times daily. 02/26/15   Historical Provider, MD  metoCLOPramide (REGLAN) 10 MG tablet Take 1 tablet (10 mg total) by mouth every 6 (six) hours. 04/13/15   Oswaldo Conroy, PA-C   BP 155/90 mmHg  Pulse 83  Temp(Src) 98.6 F (37 C) (Oral)  Resp 33  SpO2 100%  LMP 03/13/2015 Physical Exam  Constitutional: She is oriented to person, place, and time. She appears well-developed and well-nourished. She appears lethargic. No distress.  Patient sitting with eyes closed. Shallow rapid breathing. Intermittently holds her breath and desaturates. She is in minimal melena, responsive and frequently has to be prompted to answer questions, but is alert and oriented. Patient repeats "my chest hurts."  HENT:  Head: Normocephalic and atraumatic.  Dry oral mucosa, flushed appearance  Eyes: Conjunctivae and EOM are normal. Pupils are equal, round, and reactive to light.  Neck: Normal range of motion. No JVD present. No tracheal deviation present.  Cardiovascular: Normal rate and regular rhythm.   Pulmonary/Chest: Breath sounds normal.  Rapid shallow breathing.   Abdominal: Soft. Bowel sounds are normal. She exhibits no distension. There is no tenderness. There is no guarding.  Musculoskeletal: Normal range of motion. She exhibits no edema or tenderness.  Neurological: She is oriented to person, place, and time. She has normal strength. She appears lethargic. No cranial nerve deficit or  sensory deficit. GCS eye subscore is 3. GCS verbal subscore is 5. GCS motor subscore is 6.  Skin: She is not diaphoretic.  Nursing note and vitals reviewed.   ED Course  Procedures (including critical care time) Labs Review Labs Reviewed  CBC WITH DIFFERENTIAL/PLATELET - Abnormal; Notable for the  following:    WBC 12.6 (*)    Neutro Abs 9.7 (*)    All other components within normal limits  CBG MONITORING, ED - Abnormal; Notable for the following:    Glucose-Capillary 264 (*)    All other components within normal limits  COMPREHENSIVE METABOLIC PANEL  LIPASE, BLOOD  URINALYSIS, ROUTINE W REFLEX MICROSCOPIC (NOT AT [facility])  URINE RAPID DRUG SCREEN, HOSP PERFORMED    Imaging Review Ct Angio Chest Pe W/cm &/or Wo Cm  04/13/2015   CLINICAL DATA:  Chest pain for 2 days.  Recent plane travel  EXAM: CT ANGIOGRAPHY CHEST WITH CONTRAST  TECHNIQUE: Multidetector CT imaging of the chest was performed using the standard protocol during bolus administration of intravenous contrast. Multiplanar CT image reconstructions and MIPs were obtained to evaluate the vascular anatomy.  CONTRAST:  1 OMNIPAQUE IOHEXOL 350 MG/ML SOLN  COMPARISON:  None.  FINDINGS: THORACIC INLET/BODY WALL:  No acute abnormality.  MEDIASTINUM:  Normal heart size. No pericardial effusion. Prominent thymus, but within normal limits for age. No acute vascular abnormality, including pulmonary embolism. No adenopathy.  LUNG WINDOWS:  No consolidation.  No effusion.  UPPER ABDOMEN:  Negative.  OSSEOUS:  Negative.  Review of the MIP images confirms the above findings.  IMPRESSION: Negative for pulmonary embolism or other acute finding.   Electronically Signed   By: Marnee Spring M.D.   On: 04/13/2015 16:26   Dg Abd Acute W/chest  04/13/2015   CLINICAL DATA:  Vomiting since yesterday morning, chest pain beginning yesterday later in day, history diabetes  EXAM: DG ABDOMEN ACUTE W/ 1V CHEST  COMPARISON:  None  FINDINGS: Upper normal heart size. None Mediastinal contours and pulmonary vascularity normal.  Lungs clear.  No pleural effusion or pneumothorax.  Bowel gas pattern normal.  No bowel dilatation or bowel wall thickening or free intraperitoneal air.  Tiny rounded calcification in RIGHT pelvis likely represents a phlebolith, see below.  No  definite urinary tract calcification.  Osseous structures normal.  IMPRESSION: No acute abnormalities.  Probable phlebolith in RIGHT pelvis though if patient says symptoms which could be related to RIGHT ureterolithiasis, recommend correlation with urinalysis.   Electronically Signed   By: Ulyses Southward M.D.   On: 04/13/2015 10:42     EKG Interpretation   Date/Time:  Saturday April 14 2015 09:04:19 EDT Ventricular Rate:  88 PR Interval:  145 QRS Duration: 84 QT Interval:  396 QTC Calculation: 479 R Axis:   66 Text Interpretation:  Sinus rhythm Borderline prolonged QT interval  Nonspecific ST abnormality Confirmed by Denton Lank  MD, Caryn Bee ([id number]) on  04/14/2015 9:11:06 AM      MDM   Final diagnoses:  Chest pain   BP 155/86 mmHg  Pulse 68  Temp(Src) 98.6 F (37 C) (Oral)  Resp 15  SpO2 100%  LMP 03/13/2015  Patient with chesty pain. She has a hx of DM and several days of intractable vomiting. She [place]/o of burning retrosternal cp. I doubt ACS. Her EKG shows sinus arrhythmia and her RF for ACS are DM, obesity. She is hypertensive here.   Patient EKG unremarkable. CMP continues to have intractable pain and vomiting. I suspect again that  this is likely due to gastroparesis and severe esophagitis. By mouth challenge and failed. Patient will be admitted by the hospitalist team. She appears stable for admission at this time   Arthor Captain, PA-C 04/14/15 1628  Cathren Laine, MD 04/15/15 [phone]

## 2015-04-14 NOTE — ED Notes (Signed)
Pt alert and breathing without stimulation.  Non-rebreather removed.  SpO2 100% RA.

## 2015-04-14 NOTE — ED Notes (Signed)
Pt from home with worsening burning chest pain and vomiting.  Pt was seen yesterday and dx with a virus.  Pt did not have her Rx filled.  Pt in NAD, A&O.

## 2015-04-14 NOTE — ED Notes (Signed)
Pt refused PO fluids.

## 2015-04-14 NOTE — ED Notes (Addendum)
Pt had decrease in responsiveness and respiration rate/SpO2.  Placed pt on non-rebreather notified [place], [place].

## 2015-04-14 NOTE — H&P (Addendum)
Patient was seen, examined, treatment plan was discussed with the Physician extender. I have directly reviewed the clinical findings, lab, imaging studies and management of this patient in detail. I have made the necessary changes to the above noted documentation, and agree with the documentation, as recorded by the Physician extender.  19 year old female with past medical history of diabetes, morbid obesity who  Presented to [facility] ED with ongoing nausea, vomiting and mid sternal chest pain, 10/10 in intensity, constant, non radiating, present at rest. She was just seen in ED 6/10 with similar report of chest pain and had positive d dimer but CT angio was negative for PE and subsequently she was sent home. In regards to N/V her symptoms have lasted for past week or so prior to this admission without associated diarrhea. No fevers or chills.  On this admission, she was hemodynamically stable. Her blood work revealed mild leukocytosis of 12.6, potassium of 3.4 which was supplemented. No acute findings were seen on CXR.  Assessment & Plan  Principal Problem: Intractable nausea and vomiting - Unclear etiology, possible gastroparesis considering pt is diabetic however no previous A1c on file (pt not from this area). Abd x ray on 6/10 showed no acute intra-abdominal findings. - Will keep NPO until N/V improves. - Provide supportive care with IV fluids, antiemetics as needed - Would check UDS and ethanol level - Check A1c  Active Problems: SIRS - SIS criteria met on admission with hypotension and tachypnea, leukocytosis. I think this is related to N/V, pain rather than acute infection. No fevers - Check procalcitonin and lactic acid   Acute chest wall pain - Probably form nausea/vomiting, musculoskeletal  - The initial troponin level was WNL - No acute ischemic changes on 12 lead EKG - Cycle cardiac enzymes   Hypokalemia - Due to GI losses - Supplemented   Diabetes mellitus, no mention if  controlled or if with complication - NO previous A1c on file to establish if DM controlled - No reports of neuropathy only N/V which could represent gastroparesis  - A1c is pending - Use SSI while she is NPO - At home, she takes metformin     Leisa Lenz [facility] [phone]  *For further details please refer to admission note done by Physician Extender below   [facility] History and Physical                                                                                    Haley Cannon, is a 19 y.o. female  MRN: [id number]   DOB - Apr 27, 1996  Admit Date - 04/14/2015  Outpatient Primary MD - pt does not have PCP, she is not from [place]. She is visiting from [facility] Referring MD: Ashok Cordia / ER  With History of -  Past Medical History  Diagnosis Date  . Diabetes mellitus without complication       No past surgical history on file.  in for   Chief Complaint  Patient presents with  . Chest Pain  . Emesis     HPI This is a 19 year old female patient morbidly obese with diabetes on metformin who presents to the hospital with intractable nausea  and vomiting and associated chest wall pain. She is visiting from [place] and therefore we have no old records to review. Patient was initially seen in the ER yesterday on 6/10 with chest discomfort that occurs with palpation over the anterior chest wall as well as reproduced with a sip of water while in the ER. Because she had a mildly elevated d-dimer and has recently traveled from [place] a CT angiogram of the chest was completed but was without evidence of PE. She was given medication for her symptoms and IV fluid with improvement in her symptoms yesterday and was able to discharge home. She was given prescriptions including narcotic pain medications which she did not fill. Unfortunately her symptoms returned and her chest wall pain has worsened and seems to be her primary complaint although the nausea and vomiting have  continued as well. Repeat EKG was unremarkable in the ER. She was hemodynamically stable and afebrile albeit somewhat hypertensive. Laboratory data was consistent with dehydration but she had a normal anion gap and her glucose was 291. White count was still elevated although less than the previous day now decreased from 14,100-12,600 without a left shift. Chest x-ray was without acute process. Upon my discussion with the patient her symptoms began this past Wednesday morning and she's had at least 10 episodes or day of watery yellow vomiting without abdominal pain or diarrhea. She's not noticed any blood in the emesis. She does complain of some discomfort when swallowing liquid since onset of symptoms. She has been unable to check her on CBG readings. When questioned about her most recent hemoglobin A1c in [place] patient states she could not recall the number. No other symptoms reported by the patient. She is primarily complaining of unrelenting supra xiphoid discomfort. In the ER she was given a dose of IV morphine and promptly had oxygen desaturation.   Review of Systems   In addition to the HPI above,  No Fever-chills, myalgias or other constitutional symptoms No Headache, changes with Vision or hearing, new focal weakness, tingling, numbness in any extremity, No Chest pain, Cough or Shortness of Breath, palpitations, orthopnea or DOE No Abdominal pain, no melena or hematochezia, no dark tarry stools, Bowel movements are regular, No dysuria, hematuria or flank pain No new skin rashes, lesions, masses or bruises, No new joints pains-aches No recent weight gain or loss No polyuria, polydypsia or polyphagia,  *A full 10 point Review of Systems was done, except as stated above, all other Review of Systems were negative.  Social History History  Substance Use Topics  . Smoking status: Never Smoker   . Smokeless tobacco: Not on file  . Alcohol Use: No    Resides at: Private residence in  [place] with: Haley Cannon  Ambulatory status: Without assistive devices   Family History Both father and mother with hypertension  Prior to Admission medications   Medication Sig Start Date End Date Taking? Authorizing Provider  metFORMIN (GLUCOPHAGE) 500 MG tablet Take 500 mg by mouth 2 (two) times daily. 02/26/15  Yes Historical Provider, MD  HYDROcodone-acetaminophen (NORCO/VICODIN) 5-325 MG per tablet Take 1 tablet by mouth every 6 (six) hours as needed. 04/13/15   Al Corpus, PA-C  metoCLOPramide (REGLAN) 10 MG tablet Take 1 tablet (10 mg total) by mouth every 6 (six) hours. 04/13/15   Al Corpus, PA-C    No Known Allergies  Physical Exam  Vitals  Blood pressure 149/100, pulse 93, temperature 98.6 F (37 C), temperature source Oral, resp. rate 15,  last menstrual period 03/13/2015, SpO2 100 %.   General:  In mild distress as evidenced by continued complaints of supra xiphoid pain  Psych: Flat affect, Denies Suicidal or Homicidal ideations, Awakens easily, Oriented X 3. Speech and thought patterns are clear and appropriate, no apparent short term memory deficits  Neuro:   No focal neurological deficits, CN II through XII intact, Strength 5/5 all 4 extremities, Sensation intact all 4 extremities.  ENT:  Ears and Eyes appear Normal, Conjunctivae clear, PER. Moist oral mucosa without erythema or exudates.  Neck:  Supple, No lymphadenopathy appreciated  Respiratory:  Symmetrical chest wall movement, Good air movement bilaterally, CTAB. Room Air; notes significant discomfort when palpated over the most distal aspect of the anterior central chest just above the xiphoid process  Cardiac:  RRR, No Murmurs, no LE edema noted, no JVD, No carotid bruits, peripheral pulses palpable at 2+  Abdomen:  Positive bowel sounds, Soft, No abdominal tenderness including in the epigastric region immediately below where patient is endorsing chest wall painr, Non distended,  No masses  appreciated, no obvious hepatosplenomegaly  Skin:  No Cyanosis, Normal Skin Turgor, No Skin Rash or Bruise.  Extremities: Symmetrical without obvious trauma or injury,  no effusions.  Data Review  CBC  Recent Labs Lab 04/13/15 0947 04/14/15 0845  WBC 14.1* 12.6*  HGB 13.6 14.5  HCT 39.4 41.5  PLT 334 330  MCV 84.0 83.8  MCH 29.0 29.3  MCHC 34.5 34.9  RDW 12.2 12.2  LYMPHSABS 2.5 2.4  MONOABS 0.5 0.5  EOSABS 0.0 0.0  BASOSABS 0.0 0.0    Chemistries   Recent Labs Lab 04/13/15 0947 04/14/15 0845  NA 135 134*  K 4.3 3.4*  CL 99* 98*  CO2 22 21*  GLUCOSE 346* 291*  BUN 9 7  CREATININE 0.74 0.60  CALCIUM 9.4 9.3  AST 23 17  ALT 38 34  ALKPHOS 62 62  BILITOT 1.2 1.2    CrCl cannot be calculated (Unknown ideal weight.).  No results for input(s): TSH, T4TOTAL, T3FREE, THYROIDAB in the last 72 hours.  Invalid input(s): FREET3  Coagulation profile No results for input(s): INR, PROTIME in the last 168 hours.   Recent Labs  04/13/15 1400  DDIMER 0.64*    Cardiac Enzymes No results for input(s): CKMB, TROPONINI, MYOGLOBIN in the last 168 hours.  Invalid input(s): CK  Invalid input(s): POCBNP  Urinalysis    Component Value Date/Time   COLORURINE YELLOW 04/13/2015 1110   APPEARANCEUR CLEAR 04/13/2015 1110   LABSPEC 1.036* 04/13/2015 1110   PHURINE 7.0 04/13/2015 1110   GLUCOSEU >1000* 04/13/2015 1110   HGBUR NEGATIVE 04/13/2015 1110   BILIRUBINUR NEGATIVE 04/13/2015 1110   KETONESUR >80* 04/13/2015 1110   PROTEINUR 100* 04/13/2015 1110   UROBILINOGEN 1.0 04/13/2015 1110   NITRITE NEGATIVE 04/13/2015 1110   LEUKOCYTESUR NEGATIVE 04/13/2015 1110    Imaging results:   Ct Angio Chest Pe W/cm &/or Wo Cm  04/13/2015   CLINICAL DATA:  Chest pain for 2 days.  Recent plane travel  EXAM: CT ANGIOGRAPHY CHEST WITH CONTRAST  TECHNIQUE: Multidetector CT imaging of the chest was performed using the standard protocol during bolus administration of  intravenous contrast. Multiplanar CT image reconstructions and MIPs were obtained to evaluate the vascular anatomy.  CONTRAST:  1 OMNIPAQUE IOHEXOL 350 MG/ML SOLN  COMPARISON:  None.  FINDINGS: THORACIC INLET/BODY WALL:  No acute abnormality.  MEDIASTINUM:  Normal heart size. No pericardial effusion. Prominent thymus, but within normal limits for  age. No acute vascular abnormality, including pulmonary embolism. No adenopathy.  LUNG WINDOWS:  No consolidation.  No effusion.  UPPER ABDOMEN:  Negative.  OSSEOUS:  Negative.  Review of the MIP images confirms the above findings.  IMPRESSION: Negative for pulmonary embolism or other acute finding.   Electronically Signed   By: Monte Fantasia M.D.   On: 04/13/2015 16:26   Dg Chest Portable 1 View  04/14/2015   CLINICAL DATA:  Chest pain and vomiting  EXAM: PORTABLE CHEST - 1 VIEW  COMPARISON:  04/13/2015  FINDINGS: The heart size and mediastinal contours are within normal limits. Both lungs are clear. The visualized skeletal structures are unremarkable.  IMPRESSION: No active disease.   Electronically Signed   By: Inez Catalina M.D.   On: 04/14/2015 09:54   Dg Abd Acute W/chest  04/13/2015   CLINICAL DATA:  Vomiting since yesterday morning, chest pain beginning yesterday later in day, history diabetes  EXAM: DG ABDOMEN ACUTE W/ 1V CHEST  COMPARISON:  None  FINDINGS: Upper normal heart size. None Mediastinal contours and pulmonary vascularity normal.  Lungs clear.  No pleural effusion or pneumothorax.  Bowel gas pattern normal.  No bowel dilatation or bowel wall thickening or free intraperitoneal air.  Tiny rounded calcification in RIGHT pelvis likely represents a phlebolith, see below.  No definite urinary tract calcification.  Osseous structures normal.  IMPRESSION: No acute abnormalities.  Probable phlebolith in RIGHT pelvis though if patient says symptoms which could be related to RIGHT ureterolithiasis, recommend correlation with urinalysis.   Electronically  Signed   By: Lavonia Dana M.D.   On: 04/13/2015 10:42     EKG: (Independently reviewed) sinus rhythm with an arrhythmic pattern without any ischemic changes   Assessment & Plan  Principal Problem:   Intractable nausea and vomiting -Admit as observational status to medical floor -Nothing by mouth until nausea and vomiting resolved -Symptom management with antiemetics and Reglan -IV fluids at 150 mL/h -No diarrhea so doubt gastroenteritis as etiology -Given diabetes diagnosis consideration should be given to possible initial presentation of diabetic gastroparesis -As precaution check serum H. pylori noting patient does not have any epigastric pain  Active Problems:   Acute chest wall pain -On exam clearly has a reproducible chest wall pain just above the xiphoid that not include the epigastric region or any portion of the abdomen -Patient was complaining yesterday of discomfort in throat with swallowing so may have a degree of emesis related esophagitis -We'll begin scheduled Toradol every 6 hours adding patient has only had yellow and has not had any coffee-ground or bloody emesis -In the event she does have some mild emesis related esophagitis will begin twice a day IV Protonix -I have ordered low-dose IV morphine to be used for severe pain only noting patient did have some mild O2 desaturation with use of this medication in the ER    Dehydration -IV fluids until nausea vomiting resolved    Diabetes mellitus type II, uncontrolled -Hold metformin-patient did receive contrast medium for CT angiogram on 6/10-currently no evidence of metabolic acidosis -For now check CBGs every 4 hours and provide SSI -Check hemoglobin A1c to determine how well her diabetes control prior to admission    DVT Prophylaxis: Subcutaneous heparin  Family Communication: No family at bedside    Code Status:  Full code  Condition:  Stable  Discharge disposition: Anticipate once nausea vomiting and chest  discomfort resolved or improved and patient able to tolerate diet will discharge out  of hospital to friend's home and eventually will be able to return back to [place] without incident-hopefully within the next 24 hours  Time spent in minutes : 60      ELLIS,ALLISON L. ANP on 04/14/2015 at 1:12 PM  Between 7am to 7pm - Pager - [phone]  After 7pm go to [email or web address] - password [email or web address]  And look for the night coverage person covering me after hours  [facility]

## 2015-04-14 NOTE — ED Notes (Signed)
Pt took a sip a water, reported increased pain in chest.  Pt kept fluid down without dry heaves or emesis.

## 2015-04-14 NOTE — ED Notes (Addendum)
Harris PA at bedside.  Pt opens eyes spontaneously with stimulation.

## 2015-04-15 DIAGNOSIS — R0789 Other chest pain: Secondary | ICD-10-CM

## 2015-04-15 DIAGNOSIS — E860 Dehydration: Secondary | ICD-10-CM

## 2015-04-15 DIAGNOSIS — E1165 Type 2 diabetes mellitus with hyperglycemia: Secondary | ICD-10-CM

## 2015-04-15 DIAGNOSIS — R1110 Vomiting, unspecified: Secondary | ICD-10-CM

## 2015-04-15 DIAGNOSIS — R112 Nausea with vomiting, unspecified: Secondary | ICD-10-CM | POA: Insufficient documentation

## 2015-04-15 DIAGNOSIS — E1365 Other specified diabetes mellitus with hyperglycemia: Secondary | ICD-10-CM

## 2015-04-15 LAB — BASIC METABOLIC PANEL
ANION GAP: 12 (ref 5–15)
BUN: 5 mg/dL — AB (ref 6–20)
CALCIUM: 8.6 mg/dL — AB (ref 8.9–10.3)
CO2: 22 mmol/L (ref 22–32)
Chloride: 97 mmol/L — ABNORMAL LOW (ref 101–111)
Creatinine, Ser: 0.42 mg/dL — ABNORMAL LOW (ref 0.44–1.00)
GFR calc Af Amer: >60 mL/min (ref >60)
GFR calc non Af Amer: >60 mL/min (ref >60)
GLUCOSE: 204 mg/dL — AB (ref 65–99)
POTASSIUM: 3.3 mmol/L — AB (ref 3.5–5.1)
Sodium: 131 mmol/L — ABNORMAL LOW (ref 135–145)

## 2015-04-15 LAB — URINALYSIS, ROUTINE W REFLEX MICROSCOPIC
Bilirubin Urine: NEGATIVE
GLUCOSE, UA: 250 mg/dL — AB
Hgb urine dipstick: NEGATIVE
KETONES UR: 40 mg/dL — AB
LEUKOCYTES UA: NEGATIVE
Nitrite: NEGATIVE
PH: 7 (ref 5.0–8.0)
Protein, ur: NEGATIVE mg/dL
Specific Gravity, Urine: 1.017 (ref 1.005–1.030)
Urobilinogen, UA: 1 mg/dL (ref 0.0–1.0)

## 2015-04-15 LAB — GLUCOSE, CAPILLARY
GLUCOSE-CAPILLARY: 162 mg/dL — AB (ref 65–99)
Glucose-Capillary: 179 mg/dL — ABNORMAL HIGH (ref 65–99)
Glucose-Capillary: 203 mg/dL — ABNORMAL HIGH (ref 65–99)
Glucose-Capillary: 127 mg/dL — ABNORMAL HIGH (ref 65–99)
Glucose-Capillary: 185 mg/dL — ABNORMAL HIGH (ref 65–99)
Glucose-Capillary: 153 mg/dL — ABNORMAL HIGH (ref 65–99)

## 2015-04-15 LAB — CBC
HCT: 38.2 % (ref 36.0–46.0)
Hemoglobin: 12.8 g/dL (ref 12.0–15.0)
MCH: 28.3 pg (ref 26.0–34.0)
MCHC: 33.5 g/dL (ref 30.0–36.0)
MCV: 84.3 fL (ref 78.0–100.0)
Platelets: 289 10*3/uL (ref 150–400)
RBC: 4.53 MIL/uL (ref 3.87–5.11)
RDW: 12.2 % (ref 11.5–15.5)
WBC: 12.3 10*3/uL — ABNORMAL HIGH (ref 4.0–10.5)

## 2015-04-15 LAB — RAPID URINE DRUG SCREEN, HOSP PERFORMED
Amphetamines: NOT DETECTED
BARBITURATES: POSITIVE — AB
BENZODIAZEPINES: NOT DETECTED
Cocaine: NOT DETECTED
OPIATES: POSITIVE — AB
TETRAHYDROCANNABINOL: NOT DETECTED

## 2015-04-15 LAB — TROPONIN I: Troponin I: <0.03 ng/mL (ref <0.031)

## 2015-04-15 LAB — MAGNESIUM: MAGNESIUM: 2 mg/dL (ref 1.7–2.4)

## 2015-04-15 MED ORDER — POTASSIUM CHLORIDE 10 MEQ/100ML IV SOLN
10.0000 meq | INTRAVENOUS | Status: AC
  Administered 2015-04-15 (×2): 10 meq via INTRAVENOUS
  Filled 2015-04-15 (×2): qty 100

## 2015-04-15 MED ORDER — MORPHINE SULFATE 4 MG/ML IJ SOLN
4.0000 mg | Freq: Once | INTRAMUSCULAR | Status: AC
  Administered 2015-04-15: 4 mg via INTRAVENOUS
  Filled 2015-04-15: qty 1

## 2015-04-15 MED ORDER — HYDRALAZINE HCL 20 MG/ML IJ SOLN
5.0000 mg | Freq: Four times a day (QID) | INTRAMUSCULAR | Status: DC | PRN
  Filled 2015-04-15: qty 1

## 2015-04-15 MED ORDER — HYDRALAZINE HCL 20 MG/ML IJ SOLN
10.0000 mg | Freq: Once | INTRAMUSCULAR | Status: AC
  Administered 2015-04-15: 10 mg via INTRAVENOUS
  Filled 2015-04-15: qty 1

## 2015-04-15 MED ORDER — SODIUM CHLORIDE 0.9 % IV SOLN
INTRAVENOUS | Status: DC
  Administered 2015-04-15 – 2015-04-17 (×7): via INTRAVENOUS
  Filled 2015-04-15 (×11): qty 1000

## 2015-04-15 NOTE — Progress Notes (Signed)
Event/Time Occurred: BP 178/110 at 0300  Intervention: On-call clinician text paged [phone]. Response received 0312, 10mg  IV hydralazine x1 ordered and administered to patient.  Result: BP rechecked at 0403, was 157/92. Will continue to monitor.

## 2015-04-15 NOTE — Progress Notes (Addendum)
PROGRESS NOTE  Haley Cannon ZDG:[id number] DOB: 10/14/1996 DOA: 04/14/2015 PCP: No PCP Per Patient Brief history 19 year old female with a history of diabetes mellitus presented with 3 days of nausea, vomiting, and chest pain. The patient is visiting friends in [place] here she has traveled from [place] via airplane. On 04/13/2015, the patient presented to the emergency department with chest pain. CT angiogram of the chest at that time was negative for pulmonary embolus. The patient was sent home with Norco and Reglan after she was medically stabilized. Unfortunately, the patient began having worsening vomiting on the day prior to this admission. She represented to the emergency department on 04/14/2015 with continued chest wall pain and intractable vomiting. The patient denies any alcohol or illegal drugs. Urine drug screen was positive for opiates and perpetuates. The patient denies any recent unusual or raw foods. She denies any sick contacts. Urine pregnancy test was negative. 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88 -Unfortunate, the patient is unable to tell me any details regarding her diagnosis and treatment of diabetes mellitus -She has never seen an endocrinologist -The patient has been poorly compliant with metformin--has not taking for 1 week - Check anti-insulin antibody, anti-GAD,  anti-islet cell Chest wall pain -Troponin negative 2 -EKG without concerning ischemic changes -CT and sugar and chest negative for pulmonary embolus -Reproducible with palpation -Continue Toradol Hypokalemia -Replete -check mag -add K to maintenance fluids Leukocytosis -Likely stress demargination -Urinalysis is negative for pyuria -Chest x-ray negative for infiltrates -Afebrile and hemodynamically stable Elevated blood pressure -Patient denies any diagnosis of hypertension -Monitor for now -hydralazine for SBP >180   Family Communication:   Pt at beside Disposition Plan:   Home when medically stable      Procedures/Studies: Ct Angio Chest Pe W/cm &/or Wo Cm  04/13/2015   CLINICAL DATA:  Chest pain for 2 days.  Recent plane travel  EXAM: CT ANGIOGRAPHY CHEST WITH CONTRAST  TECHNIQUE: Multidetector CT imaging of the chest was performed using the standard protocol during bolus administration of intravenous contrast. Multiplanar CT image reconstructions and MIPs were obtained to evaluate the vascular anatomy.  CONTRAST:  1 OMNIPAQUE IOHEXOL 350 MG/ML SOLN  COMPARISON:  None.  FINDINGS: THORACIC INLET/BODY WALL:  No acute abnormality.  MEDIASTINUM:  Normal heart size. No pericardial effusion. Prominent thymus, but within normal limits for age. No acute vascular abnormality, including pulmonary embolism. No adenopathy.  LUNG WINDOWS:  No consolidation.  No effusion.  UPPER ABDOMEN:  Negative.  OSSEOUS:  Negative.  Review of the MIP images confirms the above findings.  IMPRESSION: Negative for pulmonary embolism or other acute finding.   Electronically Signed   By: Marnee Spring M.D.   On: 04/13/2015 16:26   Dg Chest Portable 1 View  04/14/2015   CLINICAL DATA:  Chest pain and vomiting  EXAM: PORTABLE CHEST - 1 VIEW  COMPARISON:  04/13/2015  FINDINGS: The heart size and  mediastinal contours are within normal limits. Both lungs are clear. The visualized skeletal structures are  unremarkable.  IMPRESSION: No active disease.   Electronically Signed   By: Alcide Clever M.D.   On: 04/14/2015 09:54   Dg Abd Acute W/chest  04/13/2015   CLINICAL DATA:  Vomiting since yesterday morning, chest pain beginning yesterday later in day, history diabetes  EXAM: DG ABDOMEN ACUTE W/ 1V CHEST  COMPARISON:  None  FINDINGS: Upper normal heart size. None Mediastinal contours and pulmonary vascularity normal.  Lungs clear.  No pleural effusion or pneumothorax.  Bowel gas pattern normal.  No bowel dilatation or bowel wall thickening or free intraperitoneal air.  Tiny rounded calcification in RIGHT pelvis likely represents a phlebolith, see below.  No definite urinary tract calcification.  Osseous structures normal.  IMPRESSION: No acute abnormalities.  Probable phlebolith in RIGHT pelvis though if patient says symptoms which could be related to RIGHT ureterolithiasis, recommend correlation with urinalysis.   Electronically Signed   By: Ulyses Southward M.D.   On: 04/13/2015 10:42        Subjective: Patient states that her nausea and vomiting are better. She continues to complain of chest wall pain. She denies any shortness of breath, fevers, chills, dysuria, hematuria, abdominal pain, diarrhea, hematochezia, melena. She states that the chest wall pain is substernal in nature and reproducible with palpation.  Objective: Filed Vitals:   04/15/15 0255 04/15/15 0300 04/15/15 0403 04/15/15 0424  BP: 188/106 178/110 157/92 152/88  Pulse: 76  88 83  Temp: 98.3 F (36.8 C)   98.7 F (37.1 C)  TempSrc: Oral   Oral  Resp:   20 18  Height:      Weight:      SpO2: 97%   98%    Intake/Output Summary (Last 24 hours) at 04/15/15 0819 Last data filed at 04/15/15 0730  Gross per 24 hour  Intake     30 ml  Output    850 ml  Net   -820 ml   Weight change:  Exam:   General:  Pt is alert, follows commands appropriately, not in acute distress  HEENT: No icterus, No thrush, No neck mass, [place]/AT; no  meningismus  Cardiovascular: RRR, S1/S2, no rubs, no gallops  Respiratory: CTA bilaterally, no wheezing, no crackles, no rhonchi  Abdomen: Soft/+BS, non tender, non distended, no guarding; no hepatosplenomegaly  Extremities: No edema, No lymphangitis, No petechiae, No rashes, no synovitis  Data Reviewed: Basic Metabolic Panel:  Recent Labs Lab 04/13/15 0947 04/14/15 0845 04/15/15 0656  NA 135 134* 131*  K 4.3 3.4* 3.3*  CL 99* 98* 97*  CO2 22 21* 22  GLUCOSE 346* 291* 204*  BUN 9 7 5*  CREATININE 0.74 0.60 0.42*  CALCIUM 9.4 9.3 8.6*   Liver Function Tests:  Recent Labs Lab 04/13/15 0947 04/14/15 0845  AST 23 17  ALT 38 34  ALKPHOS 62 62  BILITOT 1.2 1.2  PROT 8.3* 8.5*  ALBUMIN 4.0 3.9    Recent Labs Lab 04/13/15 0947 04/14/15 0845  LIPASE 14* 15*   No results for input(s): AMMONIA in the last 168 hours. CBC:  Recent Labs Lab 04/13/15 0947 04/14/15 0845 04/15/15 0656  WBC 14.1* 12.6* 12.3*  NEUTROABS 11.1* 9.7*  --   HGB 13.6 14.5 12.8  HCT 39.4 41.5 38.2  MCV 84.0 83.8 84.3  PLT 334 330 289   Cardiac Enzymes:  Recent Labs Lab 04/14/15 1641 04/14/15 2159 04/15/15 0656  TROPONINI <0.03 <  0.03 <0.03   BNP: Invalid input(s): POCBNP CBG:  Recent Labs Lab 04/14/15 0834 04/14/15 1707 04/14/15 2028 04/14/15 2340 04/15/15 0425  GLUCAP 264* 223* 181* 182* 179*    No results found for this or any previous visit (from the past 240 hour(s)).   Scheduled Meds: . antiseptic oral rinse  7 mL Mouth Rinse BID  . folic acid  1 mg Intravenous Daily  . heparin  5,000 Units Subcutaneous 3 times per day  . insulin aspart  0-20 Units Subcutaneous 6 times per day  . ketorolac  15 mg Intravenous 4 times per day  . metoCLOPramide (REGLAN) injection  5 mg Intravenous 4 times per day  . pantoprazole (PROTONIX) IV  40 mg Intravenous Q12H  . potassium chloride  20 mEq Oral BID  . thiamine  100 mg Intravenous Daily   Continuous Infusions: . sodium  chloride 150 mL/hr at 04/15/15 0709     Sosha Shepherd, DO  [facility] Pager [phone]  If 7PM-7AM, please contact night-coverage [email or web address] Password [facility] 04/15/2015, 8:19 AM

## 2015-04-16 DIAGNOSIS — E1110 Type 2 diabetes mellitus with ketoacidosis without coma: Secondary | ICD-10-CM

## 2015-04-16 DIAGNOSIS — E1165 Type 2 diabetes mellitus with hyperglycemia: Secondary | ICD-10-CM | POA: Insufficient documentation

## 2015-04-16 LAB — GLUTAMIC ACID DECARBOXYLASE AUTO ABS

## 2015-04-16 LAB — BASIC METABOLIC PANEL
ANION GAP: 10 (ref 5–15)
BUN: 6 mg/dL (ref 6–20)
CALCIUM: 8.7 mg/dL — AB (ref 8.9–10.3)
CHLORIDE: 98 mmol/L — AB (ref 101–111)
CO2: 24 mmol/L (ref 22–32)
Creatinine, Ser: 0.49 mg/dL (ref 0.44–1.00)
GFR calc non Af Amer: >60 mL/min (ref >60)
Glucose, Bld: 171 mg/dL — ABNORMAL HIGH (ref 65–99)
Potassium: 3.5 mmol/L (ref 3.5–5.1)
Sodium: 132 mmol/L — ABNORMAL LOW (ref 135–145)

## 2015-04-16 LAB — GLUCOSE, CAPILLARY
GLUCOSE-CAPILLARY: 153 mg/dL — AB (ref 65–99)
GLUCOSE-CAPILLARY: 222 mg/dL — AB (ref 65–99)
GLUCOSE-CAPILLARY: 147 mg/dL — AB (ref 65–99)
Glucose-Capillary: 178 mg/dL — ABNORMAL HIGH (ref 65–99)
Glucose-Capillary: 213 mg/dL — ABNORMAL HIGH (ref 65–99)
Glucose-Capillary: 161 mg/dL — ABNORMAL HIGH (ref 65–99)

## 2015-04-16 LAB — CBC
HCT: 38.5 % (ref 36.0–46.0)
HEMOGLOBIN: 13.5 g/dL (ref 12.0–15.0)
MCH: 28.9 pg (ref 26.0–34.0)
MCHC: 35.1 g/dL (ref 30.0–36.0)
MCV: 82.4 fL (ref 78.0–100.0)
Platelets: 289 10*3/uL (ref 150–400)
RBC: 4.67 MIL/uL (ref 3.87–5.11)
RDW: 12 % (ref 11.5–15.5)
WBC: 11.5 10*3/uL — ABNORMAL HIGH (ref 4.0–10.5)

## 2015-04-16 LAB — HIV ANTIBODY (ROUTINE TESTING W REFLEX): HIV SCREEN 4TH GENERATION: NONREACTIVE

## 2015-04-16 LAB — HEMOGLOBIN A1C
HEMOGLOBIN A1C: 8.9 % — AB (ref 4.8–5.6)
MEAN PLASMA GLUCOSE: 209 mg/dL

## 2015-04-16 LAB — H PYLORI, IGM, IGG, IGA AB
H Pylori IgG: <0.9 U/mL (ref 0.0–0.8)
H. Pylogi, Iga Abs: <9 units (ref 0.0–8.9)
H. Pylogi, Igm Abs: <9 units (ref 0.0–8.9)

## 2015-04-16 MED ORDER — INSULIN ASPART 100 UNIT/ML ~~LOC~~ SOLN
0.0000 [IU] | Freq: Three times a day (TID) | SUBCUTANEOUS | Status: DC
  Administered 2015-04-16: 7 [IU] via SUBCUTANEOUS
  Administered 2015-04-16 – 2015-04-17 (×3): 4 [IU] via SUBCUTANEOUS
  Administered 2015-04-17: 3 [IU] via SUBCUTANEOUS

## 2015-04-16 MED ORDER — PROMETHAZINE HCL 25 MG/ML IJ SOLN
12.5000 mg | Freq: Once | INTRAMUSCULAR | Status: AC
  Administered 2015-04-16: 12.5 mg via INTRAVENOUS
  Filled 2015-04-16: qty 1

## 2015-04-16 MED ORDER — METOCLOPRAMIDE HCL 10 MG PO TABS
10.0000 mg | ORAL_TABLET | Freq: Three times a day (TID) | ORAL | Status: DC
  Administered 2015-04-16: 10 mg via ORAL
  Filled 2015-04-16 (×7): qty 1

## 2015-04-16 MED ORDER — INSULIN ASPART 100 UNIT/ML ~~LOC~~ SOLN: 0.0000 [IU] | Freq: Every day | SUBCUTANEOUS | Status: DC

## 2015-04-16 NOTE — Care Management Note (Signed)
Case Management Note  Patient Details  Name: Haley Cannon MRN: [id number] Date of Birth: 06-13-1996  Subjective/Objective:   Patient is from [place], she has been vomiting today, not stable for dc today, patient informed MD that she needs to catch a flight tomorrow back to [place] but patient is not stable enough yet per MD. Patient states she has a pcp in [place] and she can afford her medications.                  Action/Plan:   Expected Discharge Date:                  Expected Discharge Plan:  Home/Self Care  In-House Referral:     Discharge planning Services  CM Consult  Post Acute Care Choice:    Choice offered to:     DME Arranged:    DME Agency:     HH Arranged:    HH Agency:     Status of Service:  Completed, signed off  Medicare Important Message Given:  No Date Medicare IM Given:    Medicare IM give by:    Date Additional Medicare IM Given:    Additional Medicare Important Message give by:     If discussed at Long Length of Stay Meetings, dates discussed:    Additional Comments:  Leone Haven, RN 04/16/2015, 2:19 PM

## 2015-04-16 NOTE — Discharge Summary (Signed)
Physician Discharge Summary  Virlee Stroschein ZOX:[id number] DOB: 01-23-1996 DOA: 04/14/2015  PCP: No PCP Per Patient  Admit date: 04/14/2015 Discharge date: 04/16/2015  Recommendations for Outpatient Follow-up:  1. Pt will need to follow up with PCP in 1 week post discharge 2. Please obtain BMP in one week 3. Please follow-up results on hemoglobin A1c, anti-GAD antibodies, anti-insulin antibodies  Discharge Diagnoses:  Principal Problem:   Intractable nausea and vomiting Active Problems:   Dehydration   Acute chest wall pain   Diabetes mellitus type II, uncontrolled   Hypokalemia   SIRS (systemic inflammatory response syndrome)   Diabetes mellitus with hyperglycemia   Nausea with vomiting   DKA (diabetic ketoacidoses)   Hyperglycemia due to type 2 diabetes mellitus Intractable nausea and vomiting-->DKA -I am concerned that this may be a complication of her diabetes mellitus, namely mild DKA -The patient had anion gap of 15 with serum glucose 291 and ketonuria at time of admission -certainly the ketonuria may represent starvation, but suspicious for DKA -Continue IV fluids -anion gap this am = 10 -continue [place] insulin for now -Other considerations include viral/nonspecific gastritis and esophagitis -lipase 15 -04/16/2015--the patient had significant clinical improvement. 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81 -Unfortunate, the patient is unable to tell me any details regarding her diagnosis and treatment of diabetes mellitus -She has never seen an endocrinologist -The patient has been poorly compliant with metformin--has not taking for 1 week - Check anti-insulin antibody, anti-GAD--pending at time of d/c -Hemoglobin A1c pending at the time of the discharge Chest wall pain -Troponin negative 3 -EKG without concerning ischemic  changes -CT and sugar and chest negative for pulmonary embolus -Reproducible with palpation -Continue Toradol Hypokalemia -Repleted -check mag--2.0 -add K to maintenance fluids Leukocytosis -Likely stress demargination -Urinalysis is negative for pyuria -Chest x-ray negative for infiltrates -Afebrile and hemodynamically stable -WBC 11.5 on the day of discharge Elevated blood pressure -Patient denies any diagnosis of hypertension -Monitor for now -hydralazine for SBP >180  Discharge Condition: stable  Disposition:  home--patient stated that she needs to be discharge in order to catch her flight on 04/17/2015 back to [place]  Diet:carb modified Wt Readings from Last 3 Encounters:  04/14/15 98.6 kg (217 lb 6 oz)    History of present illness:  19 year old female with a history of diabetes mellitus presented with 3 days of nausea, vomiting, and chest pain. The patient is visiting friends in [place] here she has traveled from [place] via airplane. On 04/13/2015, the patient presented to the emergency department with chest pain. CT angiogram of the chest at that time was negative for pulmonary embolus. The patient was sent home with Norco and Reglan after she was medically stabilized. Unfortunately, the patient began having worsening vomiting on the day prior to this admission. She represented to the emergency department on 04/14/2015 with continued chest wall pain and intractable vomiting. The patient denies any alcohol or illegal drugs. Urine drug screen was positive for opiates and perpetuates. The patient denies any recent unusual or raw foods. She denies any sick contacts. Urine pregnancy test was negative. The patient was started on subcutaneous insulin which she tolerated. On the following day, the patient had significant clinical improvement. Her diet was advanced which she tolerated. I discussed with the patient that she may have LADA, and I stressed importance of  follow-up  with her primary care physician when she gets back to [place]  Discharge Exam: Filed Vitals:   04/16/15 0432  BP: 171/105  Pulse: 96  Temp: 98.9 F (37.2 C)  Resp: 16   Filed Vitals:   04/15/15 1648 04/15/15 2110 04/15/15 2234 04/16/15 0432  BP: 171/101 160/93 154/92 171/105  Pulse: 71 76  96  Temp: 99.2 F (37.3 C) 100.1 F (37.8 C) 99.4 F (37.4 C) 98.9 F (37.2 C)  TempSrc: Oral Oral Oral Oral  Resp:  18  16  Height:      Weight:      SpO2: 99% 98%  100%   General: A&O x 3, NAD, pleasant, cooperative Cardiovascular: RRR, no rub, no gallop, no S3 Respiratory: CTAB, no wheeze, no rhonchi Abdomen:soft, nontender, nondistended, positive bowel sounds Extremities: No edema, No lymphangitis, no petechiae  Discharge Instructions      Discharge Instructions    Diet - low sodium heart healthy    Complete by:  As directed      Increase activity slowly    Complete by:  As directed             Medication List    TAKE these medications        HYDROcodone-acetaminophen 5-325 MG per tablet  Commonly known as:  NORCO/VICODIN  Take 1 tablet by mouth every 6 (six) hours as needed.     metFORMIN 500 MG tablet  Commonly known as:  GLUCOPHAGE  Take 500 mg by mouth 2 (two) times daily.     metoCLOPramide 10 MG tablet  Commonly known as:  REGLAN  Take 1 tablet (10 mg total) by mouth every 6 (six) hours.         The results of significant diagnostics from this hospitalization (including imaging, microbiology, ancillary and laboratory) are listed below for reference.    Significant Diagnostic Studies: Ct Angio Chest Pe W/cm &/or Wo Cm  04/13/2015   CLINICAL DATA:  Chest pain for 2 days.  Recent plane travel  EXAM: CT ANGIOGRAPHY CHEST WITH CONTRAST  TECHNIQUE: Multidetector CT imaging of the chest was performed using the standard protocol during bolus administration of intravenous contrast. Multiplanar CT image reconstructions and MIPs were obtained to  evaluate the vascular anatomy.  CONTRAST:  1 OMNIPAQUE IOHEXOL 350 MG/ML SOLN  COMPARISON:  None.  FINDINGS: THORACIC INLET/BODY WALL:  No acute abnormality.  MEDIASTINUM:  Normal heart size. No pericardial effusion. Prominent thymus, but within normal limits for age. No acute vascular abnormality, including pulmonary embolism. No adenopathy.  LUNG WINDOWS:  No consolidation.  No effusion.  UPPER ABDOMEN:  Negative.  OSSEOUS:  Negative.  Review of the MIP images confirms the above findings.  IMPRESSION: Negative for pulmonary embolism or other acute finding.   Electronically Signed   By: Marnee Spring M.D.   On: 04/13/2015 16:26   Dg Chest Portable 1 View  04/14/2015   CLINICAL DATA:  Chest pain and vomiting  EXAM: PORTABLE CHEST - 1 VIEW  COMPARISON:  04/13/2015  FINDINGS: The heart size and mediastinal contours are within normal limits. Both lungs are clear. The visualized skeletal structures are unremarkable.  IMPRESSION: No active disease.   Electronically Signed   By: Alcide Clever M.D.   On: 04/14/2015 09:54   Dg Abd Acute W/chest  04/13/2015   CLINICAL DATA:  Vomiting since yesterday morning, chest pain beginning yesterday later in day, history diabetes  EXAM: DG ABDOMEN ACUTE W/ 1V CHEST  COMPARISON:  None  FINDINGS: Upper normal heart size. None Mediastinal contours and pulmonary vascularity normal.  Lungs clear.  No pleural effusion or pneumothorax.  Bowel gas pattern normal.  No bowel dilatation or bowel wall thickening or free intraperitoneal air.  Tiny rounded calcification in RIGHT pelvis likely represents a phlebolith, see below.  No definite urinary tract calcification.  Osseous structures normal.  IMPRESSION: No acute abnormalities.  Probable phlebolith in RIGHT pelvis though if patient says symptoms which could be related to RIGHT ureterolithiasis, recommend correlation with urinalysis.   Electronically Signed   By: Ulyses Southward M.D.   On: 04/13/2015 10:42     Microbiology: No results  found for this or any previous visit (from the past 240 hour(s)).   Labs: Basic Metabolic Panel:  Recent Labs Lab 04/13/15 0947 04/14/15 0845 04/15/15 0656 04/15/15 1133 04/16/15 0609  NA 135 134* 131*  --  132*  K 4.3 3.4* 3.3*  --  3.5  CL 99* 98* 97*  --  98*  CO2 22 21* 22  --  24  GLUCOSE 346* 291* 204*  --  171*  BUN 9 7 5*  --  6  CREATININE 0.74 0.60 0.42*  --  0.49  CALCIUM 9.4 9.3 8.6*  --  8.7*  MG  --   --   --  2.0  --    Liver Function Tests:  Recent Labs Lab 04/13/15 0947 04/14/15 0845  AST 23 17  ALT 38 34  ALKPHOS 62 62  BILITOT 1.2 1.2  PROT 8.3* 8.5*  ALBUMIN 4.0 3.9    Recent Labs Lab 04/13/15 0947 04/14/15 0845  LIPASE 14* 15*   No results for input(s): AMMONIA in the last 168 hours. CBC:  Recent Labs Lab 04/13/15 0947 04/14/15 0845 04/15/15 0656 04/16/15 0609  WBC 14.1* 12.6* 12.3* 11.5*  NEUTROABS 11.1* 9.7*  --   --   HGB 13.6 14.5 12.8 13.5  HCT 39.4 41.5 38.2 38.5  MCV 84.0 83.8 84.3 82.4  PLT 334 330 289 289   Cardiac Enzymes:  Recent Labs Lab 04/14/15 1641 04/14/15 2159 04/15/15 0656  TROPONINI <0.03 <0.03 <0.03   BNP: Invalid input(s): POCBNP CBG:  Recent Labs Lab 04/15/15 1611 04/15/15 2010 04/15/15 2311 04/16/15 0155 04/16/15 0429  GLUCAP 127* 185* 153* 153* 178*    Time coordinating discharge:  Greater than 30 minutes  Signed:  Janellie Tennison, DO [facility] Pager: [phone] 04/16/2015, 11:00 AM

## 2015-04-17 ENCOUNTER — Inpatient Hospital Stay (HOSPITAL_COMMUNITY): Payer: Self-pay

## 2015-04-17 DIAGNOSIS — E0810 Diabetes mellitus due to underlying condition with ketoacidosis without coma: Secondary | ICD-10-CM

## 2015-04-17 DIAGNOSIS — R109 Unspecified abdominal pain: Secondary | ICD-10-CM | POA: Insufficient documentation

## 2015-04-17 DIAGNOSIS — E1165 Type 2 diabetes mellitus with hyperglycemia: Secondary | ICD-10-CM

## 2015-04-17 DIAGNOSIS — R1032 Left lower quadrant pain: Secondary | ICD-10-CM

## 2015-04-17 DIAGNOSIS — R072 Precordial pain: Secondary | ICD-10-CM

## 2015-04-17 DIAGNOSIS — R079 Chest pain, unspecified: Secondary | ICD-10-CM | POA: Insufficient documentation

## 2015-04-17 DIAGNOSIS — E1310 Other specified diabetes mellitus with ketoacidosis without coma: Principal | ICD-10-CM

## 2015-04-17 LAB — CBC
HEMATOCRIT: 43 % (ref 36.0–46.0)
HEMOGLOBIN: 15.4 g/dL — AB (ref 12.0–15.0)
MCH: 29.4 pg (ref 26.0–34.0)
MCHC: 35.8 g/dL (ref 30.0–36.0)
MCV: 82.2 fL (ref 78.0–100.0)
Platelets: 302 10*3/uL (ref 150–400)
RBC: 5.23 MIL/uL — ABNORMAL HIGH (ref 3.87–5.11)
RDW: 12.1 % (ref 11.5–15.5)
WBC: 12.5 10*3/uL — AB (ref 4.0–10.5)

## 2015-04-17 LAB — HEPATIC FUNCTION PANEL
ALT: 31 U/L (ref 14–54)
AST: 19 U/L (ref 15–41)
Albumin: 3.5 g/dL (ref 3.5–5.0)
Alkaline Phosphatase: 62 U/L (ref 38–126)
BILIRUBIN DIRECT: 0.2 mg/dL (ref 0.1–0.5)
BILIRUBIN INDIRECT: 0.6 mg/dL (ref 0.3–0.9)
BILIRUBIN TOTAL: 0.8 mg/dL (ref 0.3–1.2)
Total Protein: 8.4 g/dL — ABNORMAL HIGH (ref 6.5–8.1)

## 2015-04-17 LAB — BASIC METABOLIC PANEL
ANION GAP: 12 (ref 5–15)
BUN: 6 mg/dL (ref 6–20)
CHLORIDE: 97 mmol/L — AB (ref 101–111)
CO2: 23 mmol/L (ref 22–32)
Calcium: 9 mg/dL (ref 8.9–10.3)
Creatinine, Ser: 0.53 mg/dL (ref 0.44–1.00)
GFR calc non Af Amer: >60 mL/min (ref >60)
Glucose, Bld: 170 mg/dL — ABNORMAL HIGH (ref 65–99)
Potassium: 3.7 mmol/L (ref 3.5–5.1)
Sodium: 132 mmol/L — ABNORMAL LOW (ref 135–145)

## 2015-04-17 LAB — LIPASE, BLOOD: Lipase: 29 U/L (ref 22–51)

## 2015-04-17 LAB — GLUCOSE, CAPILLARY
GLUCOSE-CAPILLARY: 146 mg/dL — AB (ref 65–99)
GLUCOSE-CAPILLARY: 159 mg/dL — AB (ref 65–99)
Glucose-Capillary: 179 mg/dL — ABNORMAL HIGH (ref 65–99)

## 2015-04-17 MED ORDER — METOCLOPRAMIDE HCL 5 MG/ML IJ SOLN
5.0000 mg | Freq: Three times a day (TID) | INTRAMUSCULAR | Status: DC
  Administered 2015-04-17 (×2): 5 mg via INTRAVENOUS
  Filled 2015-04-17 (×4): qty 1

## 2015-04-17 MED ORDER — PANTOPRAZOLE SODIUM 40 MG IV SOLR
40.0000 mg | INTRAVENOUS | Status: DC
  Administered 2015-04-17: 40 mg via INTRAVENOUS
  Filled 2015-04-17: qty 40

## 2015-04-17 MED ORDER — ONDANSETRON 4 MG PO TBDP: 4.0000 mg | ORAL_TABLET | Freq: Three times a day (TID) | ORAL | Status: AC | PRN

## 2015-04-17 MED ORDER — METOCLOPRAMIDE HCL 5 MG PO TABS: 5.0000 mg | ORAL_TABLET | Freq: Three times a day (TID) | ORAL | Status: AC

## 2015-04-17 MED ORDER — PHENOL 1.4 % MT LIQD
1.0000 | OROMUCOSAL | Status: DC | PRN
  Administered 2015-04-17: 1 via OROMUCOSAL
  Filled 2015-04-17: qty 177

## 2015-04-17 MED ORDER — MENTHOL 3 MG MT LOZG
1.0000 | LOZENGE | OROMUCOSAL | Status: DC | PRN
  Filled 2015-04-17: qty 9

## 2015-04-17 NOTE — Progress Notes (Signed)
Pt discharging going to friends home and then to home in [place] tomorrow. Telemetry dc'd, IV Dc'd. Vitals stable. Discharge instructions and education reviewed with pt,all questions addressed. 04/17/2015 6:59 PM.me

## 2015-04-17 NOTE — Progress Notes (Signed)
Inpatient Diabetes Program Recommendations  AACE/ADA: New Consensus Statement on Inpatient Glycemic Control (2013)  Target Ranges:  Prepandial:   less than 140 mg/dL      Peak postprandial:   less than 180 mg/dL (1-2 hours)      Critically ill patients:  140 - 180 mg/dL   Reason for Visit: Hyperglycemia Diabetes history: DM2 Outpatient Diabetes medications: metformin 500 mg bid (has not taken since pt left for vacation to [place]) Current orders for Inpatient glycemic control: Novolog resistant tidwc and hs  Results for Haley Cannon, Haley Cannon (MRN [id number]) as of 04/17/2015 15:08  Ref. Range 04/14/2015 08:55  Hemoglobin A1C Latest Ref Range: 4.8-5.6 % 8.9 (H)  Results for Haley Cannon, Haley Cannon (MRN [id number]) as of 04/17/2015 15:08  Ref. Range 04/16/2015 11:21 04/16/2015 15:42 04/16/2015 21:25 04/17/2015 08:11 04/17/2015 12:25  Glucose-Capillary Latest Ref Range: 65-99 mg/dL 378 (H) 588 (H) 502 (H) 179 (H) 146 (H)   c-peptide and insulin antibodies test pending. Blood sugars improving.  Attempted to discuss HgbA1C results and pt appeared uninterested. Just wanted to know when she was being discharged. Pt states her HgbA1C had been a lot higher in the past. Pt also states she doesn't take her metformin when she's out of town.  Agree with current orders. Thank you. Ailene Ards, RD, LDN, CDE Inpatient Diabetes Coordinator [phone]

## 2015-04-17 NOTE — Discharge Summary (Signed)
Physician Discharge Summary  Haley Cannon TIW:[id number] DOB: 12/02/1995 DOA: 04/14/2015  PCP: No PCP Per Patient  Admit date: 04/14/2015 Discharge date: 04/17/2015  Recommendations for Outpatient Follow-up:  1. Pt will need to follow up with PCP in 1 weeks post discharge Discharge Diagnoses:  Intractable nausea and vomiting-->DKA -I am concerned that this may be a complication of her diabetes mellitus, namely mild DKA -The patient had anion gap of 15 with serum glucose 291 and ketonuria at time of admission -certainly the ketonuria may represent starvation, but suspicious for DKA -However, patient continues to have intermittent vomiting despite resolution of her ketoacidosis (6/13--N/V with advancing diet) -Suspect the patient has gastroparesis versus a degree of gastritis/esophagitis -Restart IV Reglan and intravenous Protonix -recheck LFTs and lipase -Continue IV fluids -RUQ ultrasound--neg -Urine pregnancy test was negative, urinalysis negative for polyuria -abdomen is soft and benign on exam -continue [place] insulin for now -TSH--0.633 - Pt ultimately advanced to full liquid diet which she tolerated -home with reglan and zofran prn n/v Diabetes mellitus--cannot classify type I or type II at this time -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36 -Unfortunate, the patient is unable to tell me any details regarding her diagnosis and treatment of diabetes mellitus -She has never seen an endocrinologist -The patient has been poorly compliant with metformin--has not taking for 1 week - Check C-peptide, anti-insulin antibody, anti-GAD -Anti-GAD is negative -Hemoglobin A1c-8.9 -Patient is to follow-up with her primary care physician one week after discharge -She would benefit from starting insulin, but the patient was uninterested during many conversations we had during this hospitalization Chest wall pain -Troponin negative 3 -EKG without  concerning ischemic changes -CT and sugar and chest negative for pulmonary embolus -Reproducible with palpation -Continue Toradol Hypokalemia -Repleted -check mag--2.0 -add K to maintenance fluids Leukocytosis -Likely stress demargination -Urinalysis is negative for pyuria -Chest x-ray negative for infiltrates -Afebrile and hemodynamically stable -WBC 12.5 on the day of discharge Elevated blood pressure -Patient denies any diagnosis of hypertension -Monitor for now -hydralazine for SBP >180  Discharge Condition: stable  Disposition: home  Diet:soft Wt Readings from Last 3 Encounters:  04/14/15 98.6 kg (217 lb 6 oz)    History of present illness:  19 year old female with a history of diabetes mellitus presented with 3 days of nausea, vomiting, and chest pain. The patient is visiting friends in [place] here she has traveled from [place] via airplane. On 04/13/2015, the patient presented to the emergency department with chest pain. CT angiogram of the chest at that time was negative for pulmonary embolus. The patient was sent home with Norco and Reglan after she was medically stabilized. Unfortunately, the patient began having worsening vomiting on the day prior to this admission. She represented to the emergency department on 04/14/2015 with continued chest wall pain and intractable vomiting. The patient denies any alcohol or illegal drugs. Urine drug screen was positive for opiates and barbiturates. The patient denies any recent unusual or raw foods. She denies any sick contacts. Urine pregnancy test was negative. The patient was started on subcutaneous insulin which she tolerated. On the following day, the patient had significant clinical improvement. Her diet was advanced on 04/16/2015, but the patient began having vomiting again. As a result, her diet was downgraded, and her discharge was canceled.  again, continue workup was essentially unremarkable including right upper  quadrant ultrasound the patient's that was advanced to full liquid diet which she tolerated. The patient stated that  she needed to leave to go catch a flight to go back to [place]. As a result, the patient was discharged in stable condition .  Discharge Exam: Filed Vitals:   04/17/15 1452  BP: 156/104  Pulse: 104  Temp: 98.9 F (37.2 C)  Resp: 18   Filed Vitals:   04/16/15 1425 04/16/15 2309 04/17/15 0459 04/17/15 1452  BP: 158/106 155/105 165/96 156/104  Pulse: 90 91 85 104  Temp: 98.9 F (37.2 C) 98.9 F (37.2 C) 98.5 F (36.9 C) 98.9 F (37.2 C)  TempSrc: Oral Oral Oral Oral  Resp: Height:      Weight:      SpO2:  99% 100% 100%   General: A&O x 3, NAD, pleasant, cooperative Cardiovascular: RRR, no rub, no gallop, no S3 Respiratory: CTAB, no wheeze, no rhonchi Abdomen:soft, nontender, nondistended, positive bowel sounds Extremities: No edema, No lymphangitis, no petechiae  Discharge Instructions      Discharge Instructions    Diet - low sodium heart healthy    Complete by:  As directed      Increase activity slowly    Complete by:  As directed             Medication List    STOP taking these medications        HYDROcodone-acetaminophen 5-325 MG per tablet  Commonly known as:  NORCO/VICODIN      TAKE these medications        metFORMIN 500 MG tablet  Commonly known as:  GLUCOPHAGE  Take 500 mg by mouth 2 (two) times daily.     metoCLOPramide 10 MG tablet  Commonly known as:  REGLAN  Take 1 tablet (10 mg total) by mouth every 6 (six) hours.     metoCLOPramide 5 MG tablet  Commonly known as:  REGLAN  Take 1 tablet (5 mg total) by mouth 3 (three) times daily before meals.     ondansetron 4 MG disintegrating tablet  Commonly known as:  ZOFRAN ODT  Take 1 tablet (4 mg total) by mouth every 8 (eight) hours as needed for nausea or vomiting.         The results of significant diagnostics from this hospitalization (including imaging,  microbiology, ancillary and laboratory) are listed below for reference.    Significant Diagnostic Studies: Ct Angio Chest Pe W/cm &/or Wo Cm  04/13/2015   CLINICAL DATA:  Chest pain for 2 days.  Recent plane travel  EXAM: CT ANGIOGRAPHY CHEST WITH CONTRAST  TECHNIQUE: Multidetector CT imaging of the chest was performed using the standard protocol during bolus administration of intravenous contrast. Multiplanar CT image reconstructions and MIPs were obtained to evaluate the vascular anatomy.  CONTRAST:  1 OMNIPAQUE IOHEXOL 350 MG/ML SOLN  COMPARISON:  None.  FINDINGS: THORACIC INLET/BODY WALL:  No acute abnormality.  MEDIASTINUM:  Normal heart size. No pericardial effusion. Prominent thymus, but within normal limits for age. No acute vascular abnormality, including pulmonary embolism. No adenopathy.  LUNG WINDOWS:  No consolidation.  No effusion.  UPPER ABDOMEN:  Negative.  OSSEOUS:  Negative.  Review of the MIP images confirms the above findings.  IMPRESSION: Negative for pulmonary embolism or other acute finding.   Electronically Signed   By: Marnee Spring M.D.   On: 04/13/2015 16:26   Dg Chest Portable 1 View  04/14/2015   CLINICAL DATA:  Chest pain and vomiting  EXAM: PORTABLE CHEST - 1 VIEW  COMPARISON:  04/13/2015  FINDINGS: The heart  size and mediastinal contours are within normal limits. Both lungs are clear. The visualized skeletal structures are unremarkable.  IMPRESSION: No active disease.   Electronically Signed   By: Alcide Clever M.D.   On: 04/14/2015 09:54   Dg Abd Acute W/chest  04/13/2015   CLINICAL DATA:  Vomiting since yesterday morning, chest pain beginning yesterday later in day, history diabetes  EXAM: DG ABDOMEN ACUTE W/ 1V CHEST  COMPARISON:  None  FINDINGS: Upper normal heart size. None Mediastinal contours and pulmonary vascularity normal.  Lungs clear.  No pleural effusion or pneumothorax.  Bowel gas pattern normal.  No bowel dilatation or bowel wall thickening or free  intraperitoneal air.  Tiny rounded calcification in RIGHT pelvis likely represents a phlebolith, see below.  No definite urinary tract calcification.  Osseous structures normal.  IMPRESSION: No acute abnormalities.  Probable phlebolith in RIGHT pelvis though if patient says symptoms which could be related to RIGHT ureterolithiasis, recommend correlation with urinalysis.   Electronically Signed   By: Ulyses Southward M.D.   On: 04/13/2015 10:42   US Abdomen Limited Ruq  04/17/2015   CLINICAL DATA:  Chest/abdominal pain, vomiting x3 days  EXAM: US ABDOMEN LIMITED - RIGHT UPPER QUADRANT  COMPARISON:  None.  FINDINGS: Gallbladder:  No gallstones, gallbladder wall thickening, or pericholecystic fluid. Negative sonographic Murphy's sign.  Common bile duct:  Diameter: 3 mm  Liver:  Hyperechoic hepatic parenchyma with focal fatty sparing along the gallbladder fossa.  IMPRESSION: Hepatic steatosis with focal fatty sparing.   Electronically Signed   By: Charline Bills M.D.   On: 04/17/2015 11:48     Microbiology: No results found for this or any previous visit (from the past 240 hour(s)).   Labs: Basic Metabolic Panel:  Recent Labs Lab 04/13/15 0947 04/14/15 0845 04/15/15 0656 04/15/15 1133 04/16/15 0609 04/17/15 0536  NA 135 134* 131*  --  132* 132*  K 4.3 3.4* 3.3*  --  3.5 3.7  CL 99* 98* 97*  --  98* 97*  CO2 22 21* 22  --  24 23  GLUCOSE 346* 291* 204*  --  171* 170*  BUN 9 7 5*  --  6 6  CREATININE 0.74 0.60 0.42*  --  0.49 0.53  CALCIUM 9.4 9.3 8.6*  --  8.7* 9.0  MG  --   --   --  2.0  --   --    Liver Function Tests:  Recent Labs Lab 04/13/15 0947 04/14/15 0845 04/17/15 0900  AST ALT 38 34 31  ALKPHOS 62 62 62  BILITOT 1.2 1.2 0.8  PROT 8.3* 8.5* 8.4*  ALBUMIN 4.0 3.9 3.5    Recent Labs Lab 04/13/15 0947 04/14/15 0845 04/17/15 0900  LIPASE 14* 15* 29   No results for input(s): AMMONIA in the last 168 hours. CBC:  Recent Labs Lab 04/13/15 0947  04/14/15 0845 04/15/15 0656 04/16/15 0609 04/17/15 0900  WBC 14.1* 12.6* 12.3* 11.5* 12.5*  NEUTROABS 11.1* 9.7*  --   --   --   HGB 13.6 14.5 12.8 13.5 15.4*  HCT 39.4 41.5 38.2 38.5 43.0  MCV 84.0 83.8 84.3 82.4 82.2  PLT 334 330 289 289 302   Cardiac Enzymes:  Recent Labs Lab 04/14/15 1641 04/14/15 2159 04/15/15 0656  TROPONINI <0.03 <0.03 <0.03   BNP: Invalid input(s): POCBNP CBG:  Recent Labs Lab 04/16/15 1542 04/16/15 2125 04/17/15 0811 04/17/15 1225 04/17/15 1659  GLUCAP 161* 147* 179* 146* 159*  Time coordinating discharge:  Greater than 30 minutes  Signed:  Britanni Yarde, DO [facility] Pager: [phone] 04/17/2015, 6:50 PM

## 2015-04-17 NOTE — Progress Notes (Signed)
Pt complaining of throat pain with swallowing. Pt also describes throat as feeling "tightened". Vital signs stable, 100% on room air with 18 respirations/min. [facility] on-call clinician notified, chloraseptic spray ordered.

## 2015-04-17 NOTE — Progress Notes (Signed)
,           PROGRESS NOTE  Kirston Luty NFA:[id number] DOB: [birth year] DOA: 04/14/2015 PCP: No PCP Per Patient Brief History 19 year old female with a history of diabetes mellitus presented with 3 days of nausea, vomiting, and chest pain. The patient is visiting friends in [place] here she has traveled from [place] via airplane. On 04/13/2015, the patient presented to the emergency department with chest pain. CT angiogram of the chest at that time was negative for pulmonary embolus. The patient was sent home with Norco and Reglan after she was medically stabilized. Unfortunately, the patient began having worsening vomiting on the day prior to this admission. She represented to the emergency department on 04/14/2015 with continued chest wall pain and intractable vomiting. The patient denies any alcohol or illegal drugs. Urine drug screen was positive for opiates and barbiturates. The patient denies any recent unusual or raw foods. She denies any sick contacts. Urine pregnancy test was negative. The patient was started on subcutaneous insulin which she tolerated. On the following day, the patient had significant clinical improvement. Her diet was advanced on 04/16/2015, but the patient began having vomiting again. As a result, her diet was downgraded, and her discharge was canceled Assessment/Plan: Intractable nausea and vomiting-->DKA -I am concerned that this may be a complication of her diabetes mellitus, namely mild DKA -The patient had anion gap of 15 with serum glucose 291 and ketonuria at time of admission -certainly the ketonuria may represent starvation, but suspicious for DKA -However, patient continues to have intermittent vomiting despite resolution of her ketoacidosis (6/13--N/V with advancing diet) -Suspect the patient has gastroparesis versus a degree of gastritis/esophagitis -Restart IV Reglan and intravenous Protonix -recheck LFTs and lipase -Continue IV fluids -RUQ  ultrasound -Urine pregnancy test was negative, urinalysis negative for polyuria -abdomen is soft and benign on exam -continue [place] insulin for now -TSH--0.633 Diabetes mellitus--cannot classify type I or type II at this time -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75 -Unfortunate, the patient is unable to tell me any details regarding her diagnosis and treatment of diabetes mellitus -She has never seen an endocrinologist -The patient has been poorly compliant with metformin--has not taking for 1 week - Check C-peptide, anti-insulin antibody, anti-GAD -Anti-GAD is negative -Hemoglobin A1c-8.9 Chest wall pain -Troponin negative 3 -EKG without concerning ischemic changes -CT and sugar and chest negative for pulmonary embolus -Reproducible with palpation -Continue Toradol Hypokalemia -Repleted -check mag--2.0 -add K to maintenance fluids Leukocytosis -Likely stress demargination -Urinalysis is negative for pyuria -Chest x-ray negative for infiltrates -Afebrile and hemodynamically stable -WBC 11.5 on the day of discharge Elevated blood pressure -Patient denies any diagnosis of hypertension -Monitor for now -hydralazine for SBP >180   Family Communication:   Pt at beside Disposition Plan:   Home when medically stable    Procedures/Studies: Ct Angio Chest Pe W/cm &/or Wo Cm  04/13/2015   CLINICAL DATA:  Chest pain for 2 days.  Recent plane travel  EXAM: CT ANGIOGRAPHY CHEST WITH CONTRAST  TECHNIQUE: Multidetector CT imaging of the chest was performed using the standard protocol during bolus administration of intravenous contrast. Multiplanar CT image reconstructions and MIPs were obtained to evaluate the vascular anatomy.  CONTRAST:  1 OMNIPAQUE IOHEXOL 350 MG/ML SOLN  COMPARISON:  None.  FINDINGS: THORACIC INLET/BODY WALL:  No acute abnormality.  MEDIASTINUM:  Normal heart size. No pericardial effusion. Prominent thymus, but within normal limits  for age. No acute vascular  abnormality, including pulmonary embolism. No adenopathy.  LUNG WINDOWS:  No consolidation.  No effusion.  UPPER ABDOMEN:  Negative.  OSSEOUS:  Negative.  Review of the MIP images confirms the above findings.  IMPRESSION: Negative for pulmonary embolism or other acute finding.   Electronically Signed   By: Marnee Spring M.D.   On: 04/13/2015 16:26   Dg Chest Portable 1 View  04/14/2015   CLINICAL DATA:  Chest pain and vomiting  EXAM: PORTABLE CHEST - 1 VIEW  COMPARISON:  04/13/2015  FINDINGS: The heart size and mediastinal contours are within normal limits. Both lungs are clear. The visualized skeletal structures are unremarkable.  IMPRESSION: No active disease.   Electronically Signed   By: Alcide Clever M.D.   On: 04/14/2015 09:54   Dg Abd Acute W/chest  04/13/2015   CLINICAL DATA:  Vomiting since yesterday morning, chest pain beginning yesterday later in day, history diabetes  EXAM: DG ABDOMEN ACUTE W/ 1V CHEST  COMPARISON:  None  FINDINGS: Upper normal heart size. None Mediastinal contours and pulmonary vascularity normal.  Lungs clear.  No pleural effusion or pneumothorax.  Bowel gas pattern normal.  No bowel dilatation or bowel wall thickening or free intraperitoneal air.  Tiny rounded calcification in RIGHT pelvis likely represents a phlebolith, see below.  No definite urinary tract calcification.  Osseous structures normal.  IMPRESSION: No acute abnormalities.  Probable phlebolith in RIGHT pelvis though if patient says symptoms which could be related to RIGHT ureterolithiasis, recommend correlation with urinalysis.   Electronically Signed   By: Ulyses Southward M.D.   On: 04/13/2015 10:42         Subjective:   Patient continued to have 2 episodes of small vomiting overnight. She denies any fevers, chills, chest discomfort, shortness breath, abdominal pain, dysuria. There is no hematemesis. No hematochezia or melena. Objective: Filed Vitals:   04/16/15 0432 04/16/15  1425 04/16/15 2309 04/17/15 0459  BP: 171/105 158/106 155/105 165/96  Pulse: 96 90 91 85  Temp: 98.9 F (37.2 C) 98.9 F (37.2 C) 98.9 F (37.2 C) 98.5 F (36.9 C)  TempSrc: Oral Oral Oral Oral  Resp: 16 16 18 18   Height:      Weight:      SpO2: 100%  99% 100%    Intake/Output Summary (Last 24 hours) at 04/17/15 0809 Last data filed at 04/17/15 0545  Gross per 24 hour  Intake    540 ml  Output   3600 ml  Net  -3060 ml   Weight change:  Exam:   General:  Pt is alert, follows commands appropriately, not in acute distress  HEENT: No icterus, No thrush, No meningismus, [place]/AT  Cardiovascular: RRR, S1/S2, no rubs, no gallops  Respiratory: CTA bilaterally, no wheezing, no crackles, no rhonchi  Abdomen: Soft/+BS, non tender, non distended, no guarding  Extremities: No edema, No lymphangitis, No petechiae, No rashes, no synovitis  Data Reviewed: Basic Metabolic Panel:  Recent Labs Lab 04/13/15 0947 04/14/15 0845 04/15/15 0656 04/15/15 1133 04/16/15 0609 04/17/15 0536  NA 135 134* 131*  --  132* 132*  K 4.3 3.4* 3.3*  --  3.5 3.7  CL 99* 98* 97*  --  98* 97*  CO2 22 21* 22  --  24 23  GLUCOSE 346* 291* 204*  --  171* 170*  BUN 9 7 5*  --  6 6  CREATININE 0.74 0.60 0.42*  --  0.49 0.53  CALCIUM 9.4 9.3 8.6*  --  8.7* 9.0  MG  --   --   --  2.0  --   --    Liver Function Tests:  Recent Labs Lab 04/13/15 0947 04/14/15 0845  AST 23 17  ALT 38 34  ALKPHOS 62 62  BILITOT 1.2 1.2  PROT 8.3* 8.5*  ALBUMIN 4.0 3.9    Recent Labs Lab 04/13/15 0947 04/14/15 0845  LIPASE 14* 15*   No results for input(s): AMMONIA in the last 168 hours. CBC:  Recent Labs Lab 04/13/15 0947 04/14/15 0845 04/15/15 0656 04/16/15 0609  WBC 14.1* 12.6* 12.3* 11.5*  NEUTROABS 11.1* 9.7*  --   --   HGB 13.6 14.5 12.8 13.5  HCT 39.4 41.5 38.2 38.5  MCV 84.0 83.8 84.3 82.4  PLT 334 330 289 289   Cardiac Enzymes:  Recent Labs Lab 04/14/15 1641 04/14/15 2159  04/15/15 0656  TROPONINI <0.03 <0.03 <0.03   BNP: Invalid input(s): POCBNP CBG:  Recent Labs Lab 04/16/15 0429 04/16/15 0738 04/16/15 1121 04/16/15 1542 04/16/15 2125  GLUCAP 178* 222* 213* 161* 147*    No results found for this or any previous visit (from the past 240 hour(s)).   Scheduled Meds: . antiseptic oral rinse  7 mL Mouth Rinse BID  . folic acid  1 mg Intravenous Daily  . heparin  5,000 Units Subcutaneous 3 times per day  . insulin aspart  0-20 Units Subcutaneous TID WC  . insulin aspart  0-5 Units Subcutaneous QHS  . metoCLOPramide  10 mg Oral TID AC & HS  . thiamine  100 mg Intravenous Daily   Continuous Infusions: . sodium chloride 0.9 % 1,000 mL with potassium chloride 20 mEq infusion 125 mL/hr at 04/17/15 0332     Amirra Herling, DO  [facility] Pager [phone]  If 7PM-7AM, please contact night-coverage [email or web address] Password [email or web address] 04/17/2015, 8:09 AM   LOS: 2 days

## 2015-04-18 LAB — C-PEPTIDE: C-Peptide: 2.8 ng/mL (ref 1.1–4.4)

## 2015-04-25 LAB — INSULIN ANTIBODIES, BLOOD: Insulin Antibodies, Human: 6.4 uU/mL — ABNORMAL HIGH

## 2017-01-30 IMAGING — CR DG ABDOMEN ACUTE W/ 1V CHEST
3 series · 3 of 3 positions shown · non-contrast
Comparison: None

CLINICAL DATA: Vomiting since yesterday morning, chest pain
beginning yesterday later in day, history diabetes

EXAM:
DG ABDOMEN ACUTE W/ 1V CHEST

[chest pa]
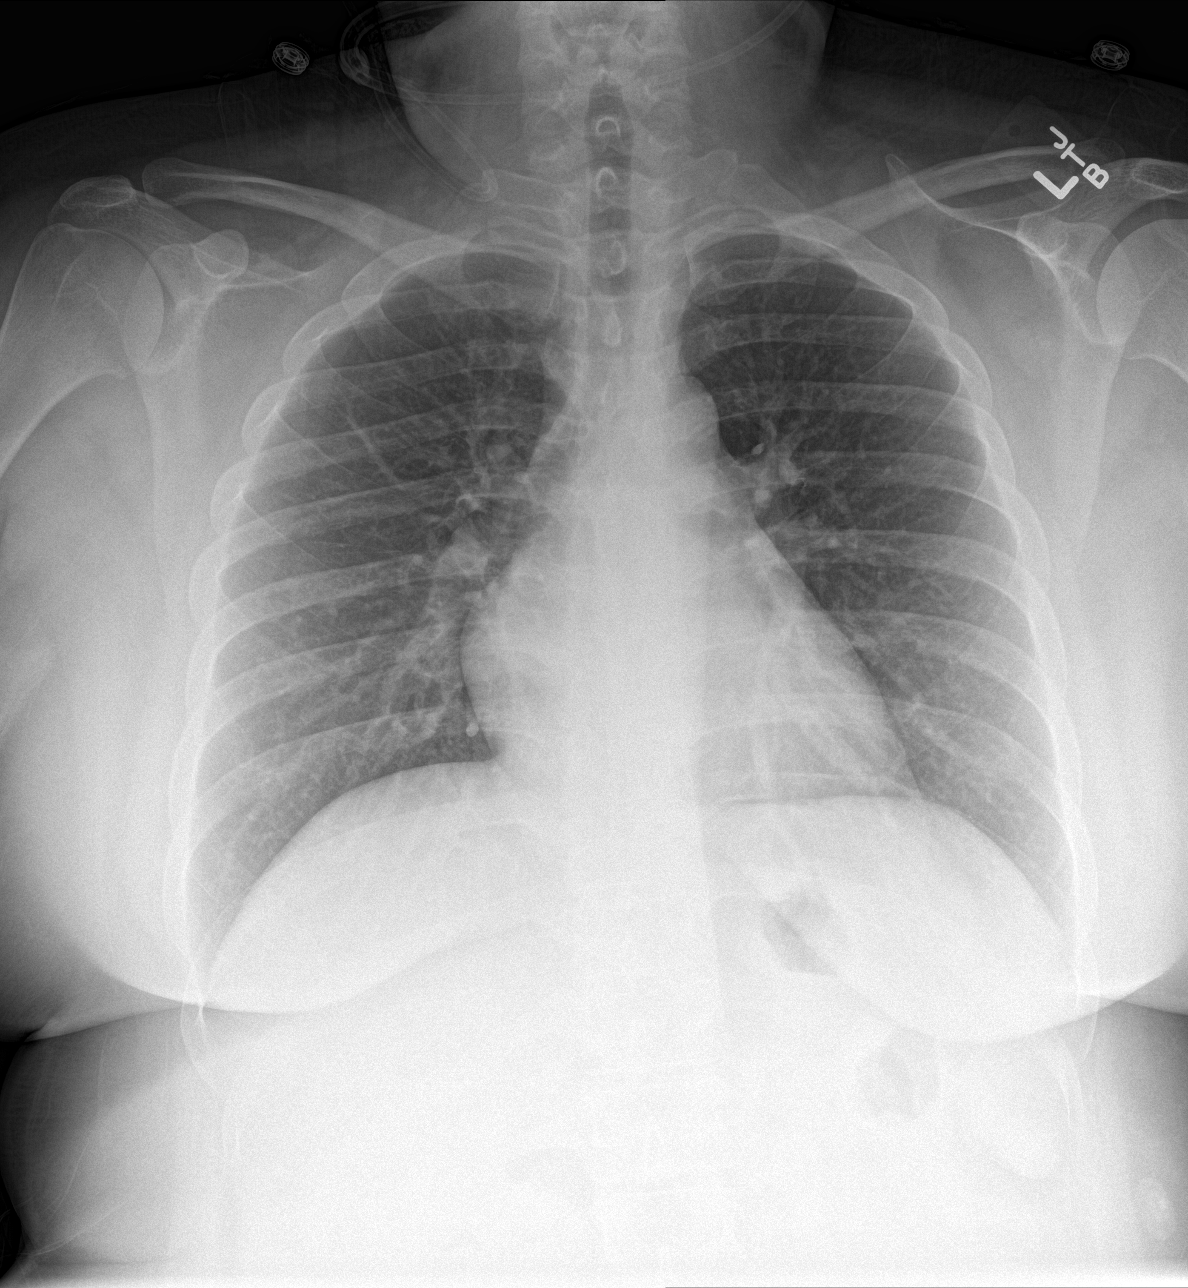

[abdomen erect]
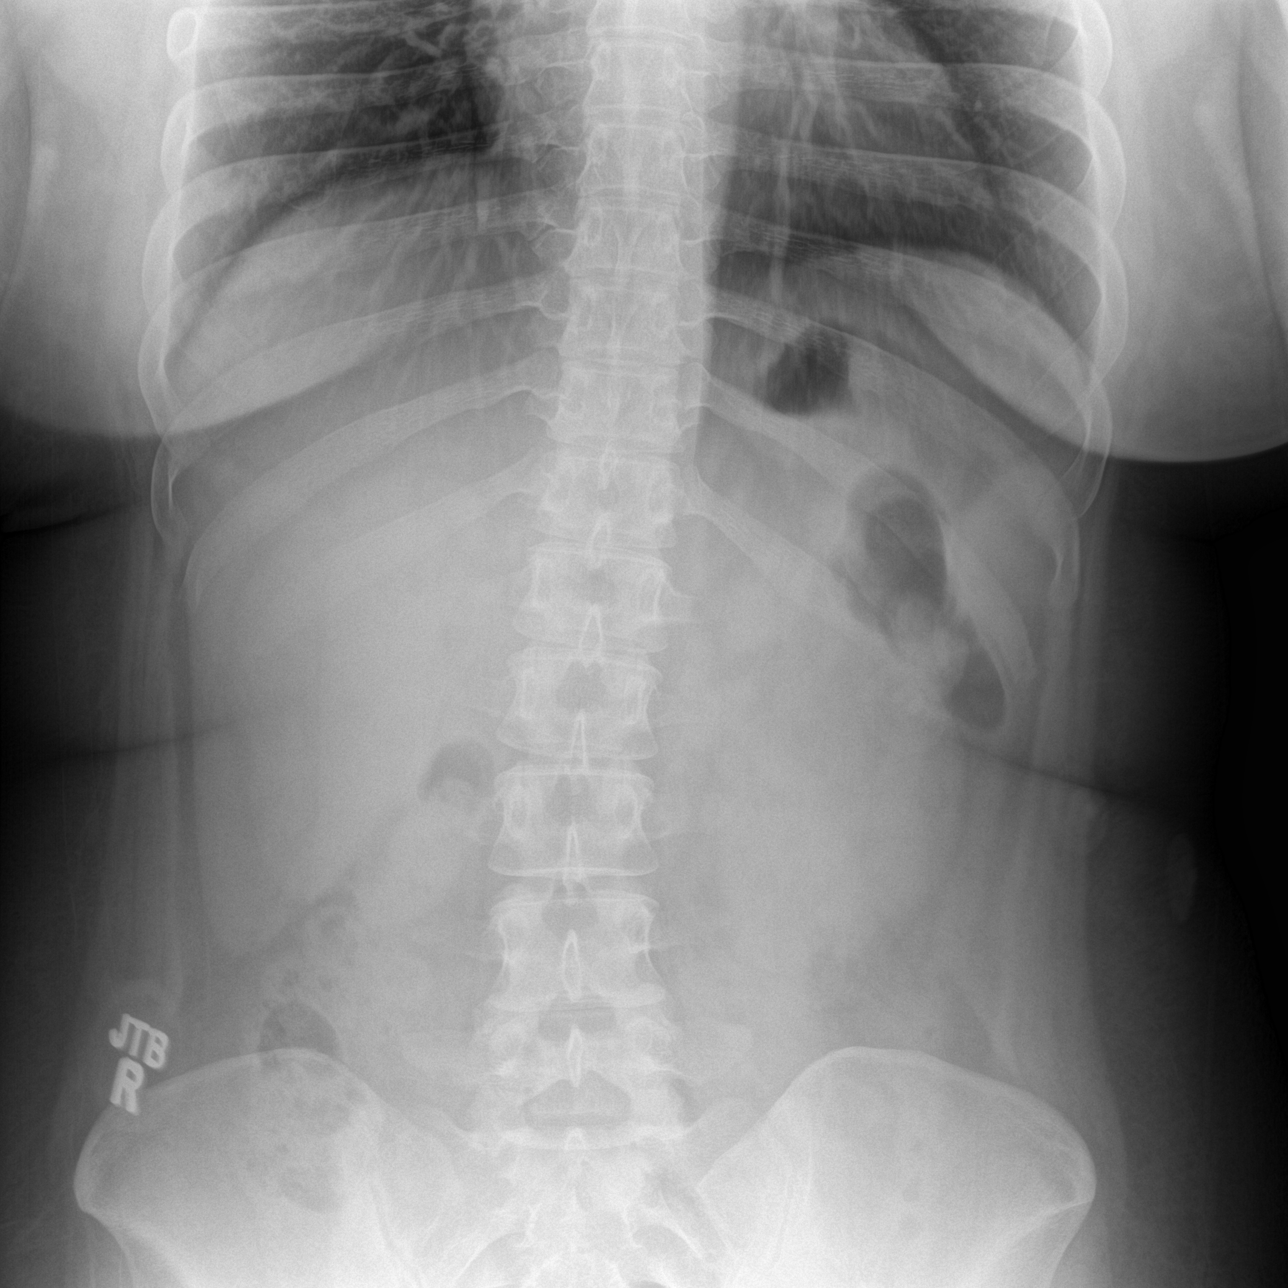

[abdomen supine]
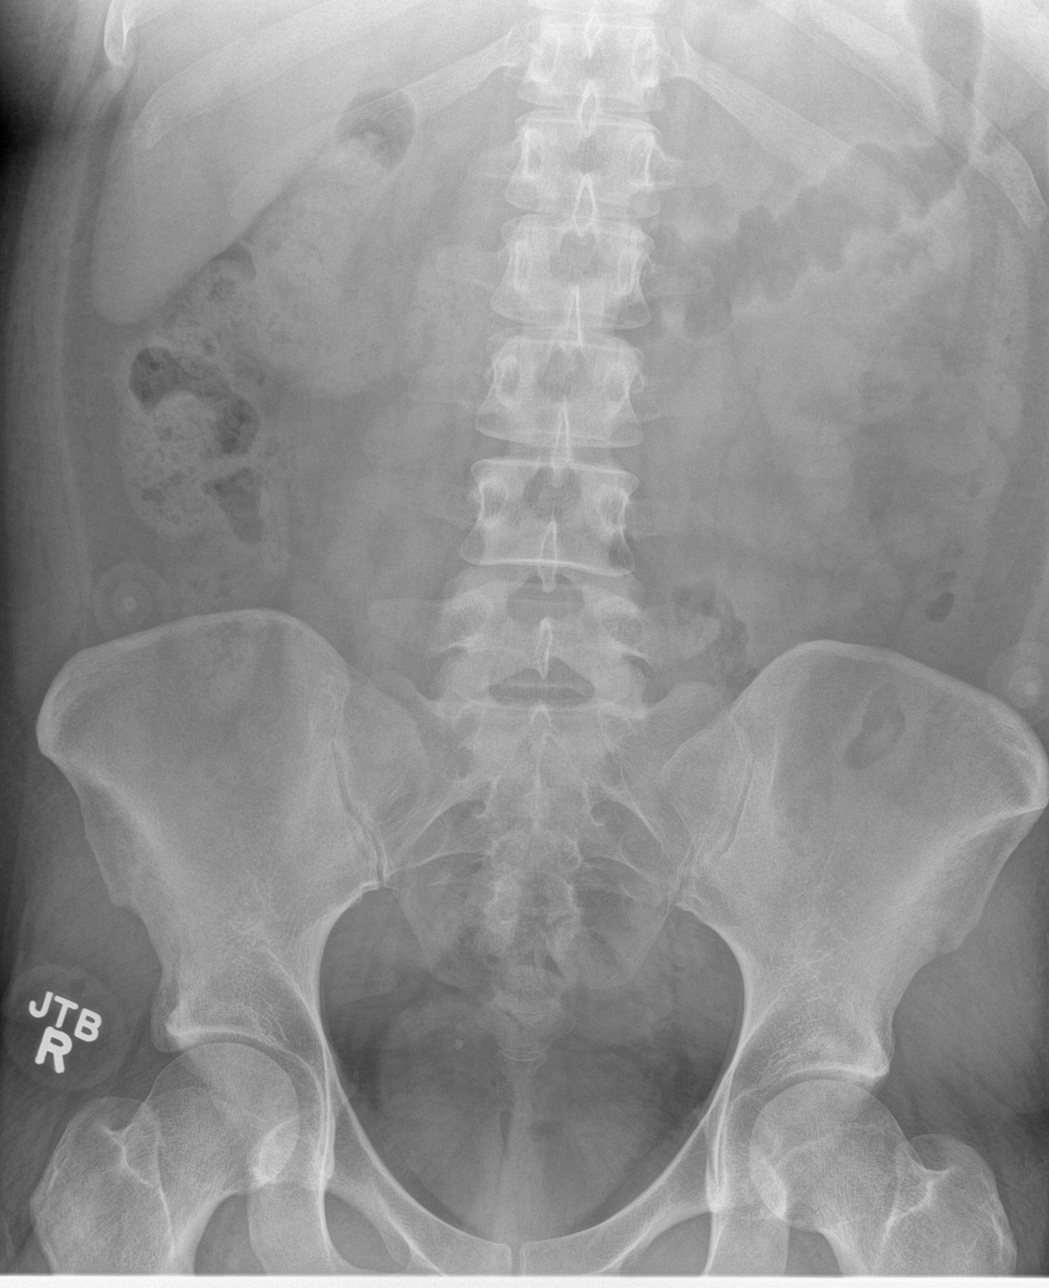

[3 of 3 positions shown; findings below may reference images not displayed]

FINDINGS: Upper normal heart size. None Mediastinal contours and pulmonary
vascularity normal.

Lungs clear.

No pleural effusion or pneumothorax.

Bowel gas pattern normal.

No bowel dilatation or bowel wall thickening or free intraperitoneal
air.

Tiny rounded calcification in RIGHT pelvis likely represents a
phlebolith, see below.

No definite urinary tract calcification.

Osseous structures normal.
IMPRESSION: No acute abnormalities.

Probable phlebolith in RIGHT pelvis though if patient says symptoms
which could be related to RIGHT ureterolithiasis, recommend
correlation with urinalysis.
